# Patient Record
Sex: Female | Born: 1935 | Race: White | Hispanic: No | Smoking: Never smoker
Health system: Southern US, Community
[De-identification: ages and names within clinical notes are randomized; demographics above are authoritative.]

## PROBLEM LIST (undated history)

## (undated) DIAGNOSIS — M810 Age-related osteoporosis without current pathological fracture: Secondary | ICD-10-CM

## (undated) DIAGNOSIS — E7800 Pure hypercholesterolemia, unspecified: Secondary | ICD-10-CM

## (undated) DIAGNOSIS — I10 Essential (primary) hypertension: Secondary | ICD-10-CM

## (undated) DIAGNOSIS — E039 Hypothyroidism, unspecified: Secondary | ICD-10-CM

## (undated) DIAGNOSIS — K219 Gastro-esophageal reflux disease without esophagitis: Secondary | ICD-10-CM

## (undated) HISTORY — DX: Gastro-esophageal reflux disease without esophagitis: K21.9

## (undated) HISTORY — DX: Pure hypercholesterolemia, unspecified: E78.00

## (undated) HISTORY — DX: Age-related osteoporosis without current pathological fracture: M81.0

## (undated) HISTORY — PX: CATARACT EXTRACTION, BILATERAL: SHX1313

## (undated) HISTORY — DX: Hypothyroidism, unspecified: E03.9

---

## 1999-02-26 ENCOUNTER — Encounter: Payer: Self-pay | Admitting: Family Medicine

## 1999-02-26 ENCOUNTER — Encounter: Admission: RE | Admit: 1999-02-26 | Discharge: 1999-02-26 | Payer: Self-pay | Admitting: Family Medicine

## 1999-03-03 ENCOUNTER — Encounter: Admission: RE | Admit: 1999-03-03 | Discharge: 1999-03-03 | Payer: Self-pay | Admitting: Family Medicine

## 1999-03-03 ENCOUNTER — Encounter: Payer: Self-pay | Admitting: Family Medicine

## 1999-05-13 ENCOUNTER — Other Ambulatory Visit: Admission: RE | Admit: 1999-05-13 | Discharge: 1999-05-13 | Payer: Self-pay | Admitting: Obstetrics and Gynecology

## 1999-09-01 ENCOUNTER — Encounter: Admission: RE | Admit: 1999-09-01 | Discharge: 1999-09-01 | Payer: Self-pay | Admitting: Family Medicine

## 1999-09-01 ENCOUNTER — Encounter: Payer: Self-pay | Admitting: Family Medicine

## 2000-02-28 ENCOUNTER — Encounter: Admission: RE | Admit: 2000-02-28 | Discharge: 2000-02-28 | Payer: Self-pay | Admitting: Family Medicine

## 2000-02-28 ENCOUNTER — Encounter: Payer: Self-pay | Admitting: Family Medicine

## 2000-11-28 ENCOUNTER — Other Ambulatory Visit: Admission: RE | Admit: 2000-11-28 | Discharge: 2000-11-28 | Payer: Self-pay | Admitting: Family Medicine

## 2000-12-14 ENCOUNTER — Encounter: Payer: Self-pay | Admitting: Family Medicine

## 2000-12-14 ENCOUNTER — Encounter: Admission: RE | Admit: 2000-12-14 | Discharge: 2000-12-14 | Payer: Self-pay | Admitting: Family Medicine

## 2001-03-22 ENCOUNTER — Encounter: Admission: RE | Admit: 2001-03-22 | Discharge: 2001-03-22 | Payer: Self-pay | Admitting: Family Medicine

## 2001-03-22 ENCOUNTER — Encounter: Payer: Self-pay | Admitting: Family Medicine

## 2002-05-23 ENCOUNTER — Encounter: Admission: RE | Admit: 2002-05-23 | Discharge: 2002-05-23 | Payer: Self-pay | Admitting: Family Medicine

## 2002-05-23 ENCOUNTER — Encounter: Payer: Self-pay | Admitting: Family Medicine

## 2003-05-26 ENCOUNTER — Encounter: Admission: RE | Admit: 2003-05-26 | Discharge: 2003-05-26 | Payer: Self-pay | Admitting: Family Medicine

## 2004-05-26 ENCOUNTER — Encounter: Admission: RE | Admit: 2004-05-26 | Discharge: 2004-05-26 | Payer: Self-pay | Admitting: Family Medicine

## 2005-06-01 ENCOUNTER — Encounter: Admission: RE | Admit: 2005-06-01 | Discharge: 2005-06-01 | Payer: Self-pay | Admitting: Family Medicine

## 2006-06-13 ENCOUNTER — Encounter: Admission: RE | Admit: 2006-06-13 | Discharge: 2006-06-13 | Payer: Self-pay | Admitting: Family Medicine

## 2007-06-20 ENCOUNTER — Encounter: Admission: RE | Admit: 2007-06-20 | Discharge: 2007-06-20 | Payer: Self-pay | Admitting: Family Medicine

## 2008-08-12 ENCOUNTER — Encounter: Admission: RE | Admit: 2008-08-12 | Discharge: 2008-08-12 | Payer: Self-pay | Admitting: Family Medicine

## 2009-05-03 DEATH — deceased

## 2010-04-07 ENCOUNTER — Other Ambulatory Visit: Payer: Self-pay | Admitting: Family Medicine

## 2010-04-07 DIAGNOSIS — Z1231 Encounter for screening mammogram for malignant neoplasm of breast: Secondary | ICD-10-CM

## 2010-04-12 ENCOUNTER — Ambulatory Visit
Admission: RE | Admit: 2010-04-12 | Discharge: 2010-04-12 | Disposition: A | Discharge status: Home / Self Care | Payer: Medicare Other | Source: Ambulatory Visit | Attending: Family Medicine | Admitting: Family Medicine

## 2010-04-12 DIAGNOSIS — Z1231 Encounter for screening mammogram for malignant neoplasm of breast: Secondary | ICD-10-CM

## 2011-12-01 ENCOUNTER — Encounter (HOSPITAL_COMMUNITY): Payer: Self-pay | Admitting: Emergency Medicine

## 2011-12-01 ENCOUNTER — Inpatient Hospital Stay (HOSPITAL_COMMUNITY)
Admission: EM | Admit: 2011-12-01 | Discharge: 2011-12-03 | DRG: 315 | Disposition: A | Discharge status: Home / Self Care | Payer: Medicare Other | Attending: Internal Medicine | Admitting: Internal Medicine

## 2011-12-01 ENCOUNTER — Emergency Department (HOSPITAL_COMMUNITY): Payer: Medicare Other

## 2011-12-01 DIAGNOSIS — E039 Hypothyroidism, unspecified: Secondary | ICD-10-CM | POA: Diagnosis present

## 2011-12-01 DIAGNOSIS — E869 Volume depletion, unspecified: Secondary | ICD-10-CM | POA: Diagnosis present

## 2011-12-01 DIAGNOSIS — Z7982 Long term (current) use of aspirin: Secondary | ICD-10-CM

## 2011-12-01 DIAGNOSIS — R011 Cardiac murmur, unspecified: Secondary | ICD-10-CM | POA: Diagnosis present

## 2011-12-01 DIAGNOSIS — I639 Cerebral infarction, unspecified: Secondary | ICD-10-CM | POA: Diagnosis present

## 2011-12-01 DIAGNOSIS — E7800 Pure hypercholesterolemia, unspecified: Secondary | ICD-10-CM | POA: Diagnosis present

## 2011-12-01 DIAGNOSIS — Z79899 Other long term (current) drug therapy: Secondary | ICD-10-CM

## 2011-12-01 DIAGNOSIS — Z881 Allergy status to other antibiotic agents status: Secondary | ICD-10-CM

## 2011-12-01 DIAGNOSIS — R29810 Facial weakness: Secondary | ICD-10-CM | POA: Diagnosis present

## 2011-12-01 DIAGNOSIS — I10 Essential (primary) hypertension: Secondary | ICD-10-CM | POA: Diagnosis present

## 2011-12-01 DIAGNOSIS — R42 Dizziness and giddiness: Secondary | ICD-10-CM | POA: Diagnosis present

## 2011-12-01 DIAGNOSIS — E871 Hypo-osmolality and hyponatremia: Secondary | ICD-10-CM | POA: Diagnosis present

## 2011-12-01 DIAGNOSIS — E876 Hypokalemia: Secondary | ICD-10-CM | POA: Diagnosis present

## 2011-12-01 DIAGNOSIS — E785 Hyperlipidemia, unspecified: Secondary | ICD-10-CM | POA: Diagnosis present

## 2011-12-01 DIAGNOSIS — I959 Hypotension, unspecified: Principal | ICD-10-CM | POA: Diagnosis present

## 2011-12-01 HISTORY — DX: Pure hypercholesterolemia, unspecified: E78.00

## 2011-12-01 HISTORY — DX: Essential (primary) hypertension: I10

## 2011-12-01 LAB — CBC
HCT: 33.8 % — ABNORMAL LOW (ref 36.0–46.0)
MCH: 29 pg (ref 26.0–34.0)
MCHC: 34 g/dL (ref 30.0–36.0)
MCV: 85.1 fL (ref 78.0–100.0)
Platelets: 224 10*3/uL (ref 150–400)
RBC: 3.97 MIL/uL (ref 3.87–5.11)
WBC: 9.2 10*3/uL (ref 4.0–10.5)

## 2011-12-01 LAB — BASIC METABOLIC PANEL
Creatinine, Ser: 1.06 mg/dL (ref 0.50–1.10)
GFR calc Af Amer: 56 mL/min — ABNORMAL LOW (ref >90)
Glucose, Bld: 123 mg/dL — ABNORMAL HIGH (ref 70–99)
Potassium: 3.8 mEq/L (ref 3.5–5.1)

## 2011-12-01 LAB — URINALYSIS, ROUTINE W REFLEX MICROSCOPIC
Glucose, UA: NEGATIVE mg/dL
Hgb urine dipstick: NEGATIVE
Ketones, ur: NEGATIVE mg/dL
Leukocytes, UA: NEGATIVE
pH: 6.5 (ref 5.0–8.0)

## 2011-12-01 LAB — TROPONIN I: Troponin I: <0.3 ng/mL (ref <0.30)

## 2011-12-01 LAB — GLUCOSE, CAPILLARY: Glucose-Capillary: 128 mg/dL — ABNORMAL HIGH (ref 70–99)

## 2011-12-01 MED ORDER — MAGNESIUM OXIDE 400 MG PO TABS: 400.0000 mg | ORAL_TABLET | Freq: Every day | ORAL | Status: DC

## 2011-12-01 MED ORDER — ASPIRIN EC 81 MG PO TBEC
81.0000 mg | DELAYED_RELEASE_TABLET | Freq: Every day | ORAL | Status: DC
  Administered 2011-12-02 – 2011-12-03 (×2): 81 mg via ORAL
  Filled 2011-12-01 (×3): qty 1

## 2011-12-01 MED ORDER — ONDANSETRON HCL 4 MG PO TABS: 4.0000 mg | ORAL_TABLET | Freq: Four times a day (QID) | ORAL | Status: DC | PRN

## 2011-12-01 MED ORDER — HYDROCODONE-ACETAMINOPHEN 5-325 MG PO TABS: 1.0000 | ORAL_TABLET | ORAL | Status: DC | PRN

## 2011-12-01 MED ORDER — ACETAMINOPHEN 325 MG PO TABS
650.0000 mg | ORAL_TABLET | Freq: Four times a day (QID) | ORAL | Status: DC | PRN
  Administered 2011-12-01 – 2011-12-02 (×4): 650 mg via ORAL
  Filled 2011-12-01 (×4): qty 2

## 2011-12-01 MED ORDER — ONDANSETRON HCL 4 MG/2ML IJ SOLN: 4.0000 mg | Freq: Four times a day (QID) | INTRAMUSCULAR | Status: DC | PRN

## 2011-12-01 MED ORDER — SODIUM CHLORIDE 0.9 % IV SOLN
INTRAVENOUS | Status: AC
  Administered 2011-12-01: 18:00:00 via INTRAVENOUS

## 2011-12-01 MED ORDER — LEVOTHYROXINE SODIUM 50 MCG PO TABS
50.0000 ug | ORAL_TABLET | Freq: Every day | ORAL | Status: DC
  Administered 2011-12-02 – 2011-12-03 (×2): 50 ug via ORAL
  Filled 2011-12-01 (×3): qty 1

## 2011-12-01 MED ORDER — SODIUM CHLORIDE 0.9 % IJ SOLN
3.0000 mL | Freq: Two times a day (BID) | INTRAMUSCULAR | Status: DC
  Administered 2011-12-02 (×2): 3 mL via INTRAVENOUS

## 2011-12-01 MED ORDER — METOPROLOL TARTRATE 1 MG/ML IV SOLN
5.0000 mg | INTRAVENOUS | Status: DC | PRN
  Filled 2011-12-01: qty 5

## 2011-12-01 MED ORDER — POTASSIUM CHLORIDE CRYS ER 20 MEQ PO TBCR
20.0000 meq | EXTENDED_RELEASE_TABLET | Freq: Every day | ORAL | Status: DC
  Administered 2011-12-01 – 2011-12-02 (×2): 20 meq via ORAL
  Filled 2011-12-01 (×2): qty 1

## 2011-12-01 MED ORDER — ALBUTEROL SULFATE (5 MG/ML) 0.5% IN NEBU: 2.5000 mg | INHALATION_SOLUTION | RESPIRATORY_TRACT | Status: DC | PRN

## 2011-12-01 MED ORDER — CALCIUM CARBONATE 600 MG PO TABS
600.0000 mg | ORAL_TABLET | Freq: Two times a day (BID) | ORAL | Status: DC
  Filled 2011-12-01: qty 1

## 2011-12-01 MED ORDER — GUAIFENESIN-DM 100-10 MG/5ML PO SYRP: 5.0000 mL | ORAL_SOLUTION | ORAL | Status: DC | PRN

## 2011-12-01 MED ORDER — POLYETHYLENE GLYCOL 3350 17 G PO PACK
17.0000 g | PACK | Freq: Every day | ORAL | Status: DC | PRN
  Filled 2011-12-01: qty 1

## 2011-12-01 MED ORDER — CALCIUM CARBONATE 1250 (500 CA) MG PO TABS
1.0000 | ORAL_TABLET | Freq: Two times a day (BID) | ORAL | Status: DC
  Administered 2011-12-02 – 2011-12-03 (×3): 500 mg via ORAL
  Filled 2011-12-01 (×6): qty 1

## 2011-12-01 MED ORDER — SIMVASTATIN 10 MG PO TABS
10.0000 mg | ORAL_TABLET | Freq: Every day | ORAL | Status: DC
  Administered 2011-12-01 – 2011-12-02 (×2): 10 mg via ORAL
  Filled 2011-12-01 (×3): qty 1

## 2011-12-01 MED ORDER — MAGNESIUM OXIDE 400 (241.3 MG) MG PO TABS
400.0000 mg | ORAL_TABLET | Freq: Every day | ORAL | Status: DC
  Administered 2011-12-02 – 2011-12-03 (×2): 400 mg via ORAL
  Filled 2011-12-01 (×3): qty 1

## 2011-12-01 NOTE — Consult Note (Signed)
Reason for Consult:Facial droop Referring Physician: Rhunette Croft, A  CC: Facial droop  History is obtained from:Patient, family  HPI: Stacey Stevens is a 78 y.o. female who was walking towards the bathroom when she felt lightheaded. She subsequently had her vision go out and felt very lightheaded. EMS was called and on arrival, they were unable to palpate her pulse or get a BP, but it subsequently was obtained at systolic 101. She was given IV fluids with improvement to the 140s. The majority of her symptoms resolved, but she was left with a new left facial droop and tongue deviation.   Neurology was consulted for recommendations.   LSN: 3 pm NIHSS: 1 tpa given: no, mild deficit   ROS: A 14 point ROS was performed and is negative except as noted in the HPI.  Past Medical History  Diagnosis Date  . Hypertension   . High blood cholesterol     Family History: Father - brainstem stroke  Social History: Tob: none  Exam: Current vital signs: BP 128/58  Pulse 84  Temp 97.8 F (36.6 C) (Oral)  Resp 18  SpO2 99% Vital signs in last 24 hours: Temp:  [97.8 F (36.6 C)] 97.8 F (36.6 C) (11/28 1559) Pulse Rate:  [84] 84  (11/28 1559) Resp:  [18] 18  (11/28 1559) BP: (128)/(58) 128/58 mmHg (11/28 1559) SpO2:  [99 %] 99 % (11/28 1559)  General: in bed, nad CV: RRR Mental Status: Patient is awake, alert, oriented to person, place, month, year, and situation. Immediate and remote memory are intact. Patient is able to give a clear and coherent history. Cranial Nerves: II: Visual Fields are full. Pupils are equal, round, and reactive to light.  Discs are difficult to visualize. III,IV, VI: EOMI without ptosis or diploplia.  V: Facial sensation is symmetric to temperature VII: Facial movement is notable for a left facial weakness on smile.  VIII: hearing is intact to voice X: Uvula elevates symmetrically XI: Shoulder shrug is symmetric. XII: tongue deviates mildly to the left.    Motor: Tone is normal. Bulk is normal. 5/5 strength was present in all four extremities.  Sensory: Sensation is symmetric to light touch and temperature in the arms and legs. Deep Tendon Reflexes: 2+ and symmetric in the biceps and patellae.  Cerebellar: FNF and hks are intact bilaterally Gait: Did not assess 2/2 multiple monitors in the ED setting  I have reviewed labs in epic and the results pertinent to this consultation are: CBC - mild anemia  I have reviewed the images obtained:CT head - no acute changes.   Impression: 78 yo F with transient episode of lightheadedness and vision change in the setting of likely low BP. The reason for her low BP is unclear to me, but I suspect that she hypoperfused causing posterior circulation ischemia. With both visual fields affected and low BP, I feel that embolic/thrombotic etiology is less likely. The cause of her BP drop is unclear to me, but will need a workup for this, but I will defer to medicine.   Recommendations: 1. HgbA1c, fasting lipid panel 2. MRI, MRA  of the brain without contrast. MRA neck with contrast 3. PT consult, OT consult, Speech consult 4. Echocardiogram 5. Carotid dopplers do not need to be performed as we will get MRA neck.  6. Prophylactic therapy-Antiplatelet med: Aspirin - dose 81 mg 7. Risk factor modification 8. Telemetry monitoring 9. Frequent neuro checks   Ritta Slot, MD [facility] [phone]  If 7pm-  7am, please page neurology on call at [phone].

## 2011-12-01 NOTE — ED Notes (Signed)
Pt arrived by EMS. Pt at church and cleaning up [employer], went to the bathroom upstairs, came down stair became weak and dizzy. When EMS arrived, unable to obtain radial pulse and auscultate BP. Then BP came to 101 systolic. IV started went to give fluids, BP was checked again and 140/74.

## 2011-12-01 NOTE — ED Provider Notes (Addendum)
History     CSN: [id number]  Arrival date & time 12/01/11  1534   First MD Initiated Contact with Patient 12/01/11 1538      Chief Complaint  Patient presents with  . Dizziness  . Weakness    (Consider location/radiation/quality/duration/timing/severity/associated sxs/prior treatment) Patient is a 78 y.o. female presenting with neurologic complaint. The history is provided by the patient.  Neurologic Problem The primary symptoms include loss of consciousness and dizziness. Primary symptoms do not include fever. The symptoms began less than 1 hour ago. The symptoms are unchanged. The neurological symptoms are diffuse.  Loss of consciousness began less than 1 hour ago. The loss of consciousness lasted less than one minute. The loss of consciousness was not witnessed.  Dizziness also occurs with weakness (Diffuse).  Additional symptoms include weakness (Diffuse). Additional symptoms do not include neck stiffness.    Past Medical History  Diagnosis Date  . Hypertension   . High blood cholesterol     History reviewed. No pertinent past surgical history.  No family history on file.  History  Substance Use Topics  . Smoking status: Never Smoker   . Smokeless tobacco: Not on file  . Alcohol Use: No    OB History    Grav Para Term Preterm Abortions TAB SAB Ect Mult Living                  Review of Systems  Constitutional: Negative for fever and chills.  HENT: Negative for neck stiffness.   Respiratory: Negative for cough and shortness of breath.   Neurological: Positive for dizziness, loss of consciousness and weakness (Diffuse).  All other systems reviewed and are negative.    Allergies  Amoxicillin; Keflex; and Macrodantin  Home Medications   Current Outpatient Rx  Name  Route  Sig  Dispense  Refill  . AMLODIPINE BESYLATE 5 MG PO TABS   Oral   Take 5 mg by mouth daily.         . ASPIRIN EC 81 MG PO TBEC   Oral   Take 81 mg by mouth daily.           Marland Kitchen CALCIUM CARBONATE 600 MG PO TABS   Oral   Take 600 mg by mouth 2 (two) times daily with a meal.         . LEVOTHYROXINE SODIUM 50 MCG PO TABS   Oral   Take 50 mcg by mouth daily.         Marland Kitchen LOSARTAN POTASSIUM-HCTZ 50-12.5 MG PO TABS   Oral   Take 1 tablet by mouth daily.         Marland Kitchen LOVASTATIN 20 MG PO TABS   Oral   Take 20 mg by mouth at bedtime.         Marland Kitchen MAGNESIUM OXIDE 400 MG PO TABS   Oral   Take 400 mg by mouth daily.         . ADULT MULTIVITAMIN W/MINERALS CH   Oral   Take 1 tablet by mouth daily.         Marland Kitchen POTASSIUM CHLORIDE CRYS ER 20 MEQ PO TBCR   Oral   Take 20 mEq by mouth daily.         Marland Kitchen RALOXIFENE HCL 60 MG PO TABS   Oral   Take 60 mg by mouth daily.           BP 128/58  Pulse 84  Temp 97.8 F (36.6 C) (Oral)  Resp  18  SpO2 99%  Physical Exam  Nursing note and vitals reviewed. Constitutional: She is oriented to person, place, and time. She appears well-developed and well-nourished. No distress.  HENT:  Head: Normocephalic and atraumatic.  Eyes: EOM are normal. Pupils are equal, round, and reactive to light.  Neck: Normal range of motion. Neck supple.  Cardiovascular: Normal rate and regular rhythm.  Exam reveals no friction rub.   No murmur heard. Pulmonary/Chest: Effort normal and breath sounds normal. No respiratory distress. She has no wheezes. She has no rales.  Abdominal: Soft. She exhibits no distension. There is no tenderness. There is no rebound.  Musculoskeletal: Normal range of motion. She exhibits no edema.  Neurological: She is alert and oriented to person, place, and time. A cranial nerve deficit (left-sided facial droop, left tongue deviation.) is present. She exhibits abnormal muscle tone (Mild left-sided weakness.).  Skin: She is not diaphoretic.    ED Course  Procedures (including critical care time)   Labs Reviewed  CBC  BASIC METABOLIC PANEL  URINALYSIS, ROUTINE W REFLEX MICROSCOPIC  TROPONIN I   LACTIC ACID, PLASMA   Ct Head Wo Contrast  12/01/2011  *RADIOLOGY REPORT*  Clinical Data: 78 year old female with left facial droop.  CT HEAD WITHOUT CONTRAST  Technique:  Contiguous axial images were obtained from the base of the skull through the vertex without contrast.  Comparison: None  Findings: An age indeterminate lacunar infarct within the left thalamus is noted. Mild chronic small vessel white matter ischemic changes are present.  No acute intracranial abnormalities are identified, including mass lesion or mass effect, hydrocephalus, extra-axial fluid collection, midline shift or hemorrhage.  The visualized bony calvarium is unremarkable.  IMPRESSION: Age indeterminate left thalamic lacunar infarct.  No evidence of hemorrhage.  Mild chronic small vessel white matter ischemic changes.   Original Report Authenticated By: Harmon Pier, M.D.    Dg Chest Port 1 View  12/01/2011  *RADIOLOGY REPORT*  Clinical Data: Weakness  PORTABLE CHEST - 1 VIEW  Comparison: None.  Findings: Lungs are clear without infiltrate or effusion.  Negative for heart failure or pneumonia or mass lesion.  The heart size is mildly enlarged.  IMPRESSION: No active cardiopulmonary disease.   Original Report Authenticated By: Janeece Riggers, M.D.      1. High blood cholesterol   2. Hypertension   3. Dizziness   4. Dyslipidemia   5. HTN (hypertension)      Date: 12/01/2011  Rate: 81  Rhythm: normal sinus rhythm  QRS Axis: normal  Intervals: QRS prolonged, 132  ST/T Wave abnormalities: normal  Conduction Disutrbances:right bundle branch block  Narrative Interpretation:   Old EKG Reviewed: none available    MDM   55F with hx of HTN, HLD presents dizziness, weakness. Patient was cooking food all day for Thanksgiving and began to have abdominal pain in her low abdomen. Pain non-radiating. She then felt very light headed and weak. No syncope. Denied CP, SOB. Initially with EMS they were unable to obtain a pressure,  then during transport patient's pressure returned to normal and she states she felt much better. On arrival vitals stable, no pain, no more dizziness. On neurologic exam - L sided facial droop noticed. Mild tongue deviation to the left. Mild left-sided weakness. Patient does not know time of exact onset. Son states some confusion about 3 hours ago prior to the meal, however no one noticed any facial droop. I spoke with Dr. Amada Jupiter with Neuro who states she does not meet TPA  criteria so she doesn't need a Code Stroke activation. She was sent for emergent Head CT and Neuro will see her.  Head CT with age-indeterminate lacunar infarct. I spoke with Dr. Amada Jupiter of neurology states that he feels that her hypotension during her syncopal episode caused some diffuse ischemia and a brainstem stroke. She does not need an emergent MRI, however she will be admitted by medicine for MRI in the morning. Patient stable upon transfer to the floor.        Elwin Mocha, MD 12/01/11 2039 signed out as he is  Elwin Mocha, MD 12/03/11 [phone]

## 2011-12-01 NOTE — H&P (Signed)
[facility]                                                                                    Patient Demographics  Stacey Stevens, is a 78 y.o. female  CSN: [id number]  MRN: [id number]  DOB - Oct 05, 1928  Admit Date - 12/01/2011  Outpatient Primary MD for the patient is Benita Stabile, MD   With History of -  Past Medical History  Diagnosis Date  . Hypertension   . High blood cholesterol       History reviewed. No pertinent past surgical history.  in for   Chief Complaint  Patient presents with  . Dizziness  . Weakness     HPI  Stacey Stevens  is a 78 y.o. female, who has history of hypothyroidism, hypertension, dyslipidemia, heart murmur who was in her usual state of health and spending time with family over Thanksgiving dinner, patient says she was in her kitchen when she started feeling poorly, she had a sensation of going to the bathroom and as she was climbing her stairs she started feeling dizzy and her vision got blurred, family members thought that she was having a stroke and they called 911, when EMS arrived blood pressure was found to be low, she was brought to the ER where after some IV fluids blood pressure was stabilized, patient's initial presentation showed left-sided facial weakness, initial workup showed slightly low sodium along with CT evidence of age unclear stroke.  She was seen by neurologist who suggested the internal medicine admission, at present time patient is readily symptom-free, she is feeling close to her baseline, her exam reveals mild left-sided facial weakness otherwise unremarkable exam.    Review of Systems  at the present time negative review of systems  In addition to the HPI above,  No Fever-chills, No Headache, No changes with Vision or hearing, No problems swallowing food or Liquids, No Chest pain, Cough or Shortness of Breath, No Abdominal pain, No Nausea or Vommitting, Bowel movements are regular, No Blood in  stool or Urine, No dysuria, No new skin rashes or bruises, No new joints pains-aches,  No new weakness, tingling, numbness in any extremity, No recent weight gain or loss, No polyuria, polydypsia or polyphagia, No significant Mental Stressors.  A full 10 point Review of Systems was done, except as stated above, all other Review of Systems were negative.   Social History History  Substance Use Topics  . Smoking status: Never Smoker   . Smokeless tobacco: Not on file  . Alcohol Use: No     Family History History of CVA in patient's father  Prior to Admission medications   Medication Sig Start Date End Date Taking? Authorizing Provider  amLODipine (NORVASC) 5 MG tablet Take 5 mg by mouth daily.   Yes Historical Provider, MD  aspirin EC 81 MG tablet Take 81 mg by mouth daily.   Yes Historical Provider, MD  calcium carbonate (OS-CAL) 600 MG TABS Take 600 mg by mouth 2 (two) times daily with a meal.   Yes Historical Provider, MD  levothyroxine (SYNTHROID, LEVOTHROID) 50 MCG tablet Take 50 mcg by mouth daily.   Yes  Historical Provider, MD  losartan-hydrochlorothiazide (HYZAAR) 50-12.5 MG per tablet Take 1 tablet by mouth daily.   Yes Historical Provider, MD  lovastatin (MEVACOR) 20 MG tablet Take 20 mg by mouth at bedtime.   Yes Historical Provider, MD  magnesium oxide (MAG-OX) 400 MG tablet Take 400 mg by mouth daily.   Yes Historical Provider, MD  Multiple Vitamin (MULTIVITAMIN WITH MINERALS) TABS Take 1 tablet by mouth daily.   Yes Historical Provider, MD  potassium chloride SA (K-DUR,KLOR-CON) 20 MEQ tablet Take 20 mEq by mouth daily.   Yes Historical Provider, MD  raloxifene (EVISTA) 60 MG tablet Take 60 mg by mouth daily.   Yes Historical Provider, MD    Allergies  Allergen Reactions  . Amoxicillin Other (See Comments)    Reaction unknown  . Keflex (Cephalexin) Other (See Comments)    Reaction unknown  . Macrodantin (Nitrofurantoin Macrocrystal) Other (See Comments)     Reaction unknown    Physical Exam  Vitals  Blood pressure 135/59, pulse 84, temperature 98.6 F (37 C), temperature source Oral, resp. rate 18, SpO2 97.00%.   1. General elderly white female lying in bed in NAD,   2. Normal affect and insight, Not Suicidal or Homicidal, Awake Alert, Oriented X 3.  3. No F.N deficits, Mild L.facial weakness all other C.Nerves Intact, Strength 5/5 all 4 extremities, Sensation intact all 4 extremities, Plantars down going.  4. Ears and Eyes appear Normal, Conjunctivae clear, PERRLA. Moist Oral Mucosa.  5. Supple Neck, No JVD, No cervical lymphadenopathy appriciated, No Carotid Bruits.  6. Symmetrical Chest wall movement, Good air movement bilaterally, CTAB.  7. RRR, No Gallops, Rubs, loud systolic murmur Murmurs, No Parasternal Heave.  8. Positive Bowel Sounds, Abdomen Soft, Non tender, No organomegaly appriciated,No rebound -guarding or rigidity.  9.  No Cyanosis, Normal Skin Turgor, No Skin Rash or Bruise.  10. Good muscle tone,  joints appear normal , no effusions, Normal ROM.  11. No Palpable Lymph Nodes in Neck or Axillae     Data Review  CBC  Lab 12/01/11 1635  WBC 9.2  HGB 11.5*  HCT 33.8*  PLT 224  MCV 85.1  MCH 29.0  MCHC 34.0  RDW 11.9  LYMPHSABS --  MONOABS --  EOSABS --  BASOSABS --  BANDABS --   ------------------------------------------------------------------------------------------------------------------  Chemistries   Lab 12/01/11 1635  NA 128*  K 3.8  CL 91*  CO2 25  GLUCOSE 123*  BUN 25*  CREATININE 1.06  CALCIUM 9.6  MG --  AST --  ALT --  ALKPHOS --  BILITOT --   ------------------------------------------------------------------------------------------------------------------ CrCl is unknown because there is no height on file for the current visit. ------------------------------------------------------------------------------------------------------------------ No results found for this  basename: TSH,T4TOTAL,FREET3,T3FREE,THYROIDAB in the last 72 hours   Coagulation profile No results found for this basename: INR:5,PROTIME:5 in the last 168 hours ------------------------------------------------------------------------------------------------------------------- No results found for this basename: DDIMER:2 in the last 72 hours -------------------------------------------------------------------------------------------------------------------  Cardiac Enzymes  Lab 12/01/11 1635  CKMB --  TROPONINI <0.30  MYOGLOBIN --   ------------------------------------------------------------------------------------------------------------------ No components found with this basename: POCBNP:3   ---------------------------------------------------------------------------------------------------------------  Urinalysis No results found for this basename: colorurine, appearanceur, labspec, phurine, glucoseu, hgbur, bilirubinur, ketonesur, proteinur, urobilinogen, nitrite, leukocytesur    ----------------------------------------------------------------------------------------------------------------  Imaging results:   Ct Head Wo Contrast  12/01/2011  *RADIOLOGY REPORT*  Clinical Data: 78 year old female with left facial droop.  CT HEAD WITHOUT CONTRAST  Technique:  Contiguous axial images were obtained from the base of the skull through  the vertex without contrast.  Comparison: None  Findings: An age indeterminate lacunar infarct within the left thalamus is noted. Mild chronic small vessel white matter ischemic changes are present.  No acute intracranial abnormalities are identified, including mass lesion or mass effect, hydrocephalus, extra-axial fluid collection, midline shift or hemorrhage.  The visualized bony calvarium is unremarkable.  IMPRESSION: Age indeterminate left thalamic lacunar infarct.  No evidence of hemorrhage.  Mild chronic small vessel white matter ischemic changes.    Original Report Authenticated By: Harmon Pier, M.D.    Dg Chest Port 1 View  12/01/2011  *RADIOLOGY REPORT*  Clinical Data: Weakness  PORTABLE CHEST - 1 VIEW  Comparison: None.  Findings: Lungs are clear without infiltrate or effusion.  Negative for heart failure or pneumonia or mass lesion.  The heart size is mildly enlarged.  IMPRESSION: No active cardiopulmonary disease.   Original Report Authenticated By: Janeece Riggers, M.D.     My personal review of EKG: Rhythm NSR, RBBB , no Acute ST changes    Assessment & Plan   1. Left-sided facial weakness, dizziness, blurry vision - due to stroke versus hypoperfusion due to transient hypotension could be from vasovagal etiology, patient has been seen by neurology, her symptoms have resolved, exam is consistent with mild left-sided facial weakness, she will be admitted on the neuro floor, telemetry monitoring, she will undergo MRI MRA of the brain, echogram, carotid duplex, A1c, lipid panel, evaluation by PT, OT and speech, she has passed a bedside swallow study, will also monitor her orthostatics, since she is hyponatremic and was hypotensive initially she could be mildly dehydrated gentle normal saline for hydration. We'll place her on aspirin for now.   2. Hyponatremia which is mild. Could be secondary to mild dehydration, will check urine sodium and osmolality, will check serum of severity, will place her on normal saline gently and repeat BMP in the morning.    3. Hypertension.- Medications will be held as she was hypotensive during her initial presentation and there is a chance that she could have a new CVA, will order when necessary IV Lopressor with written parameters, allow for permissive hypertension at this time.    4. History of dyslipidemia and hypothyroidism. No acute issues home medications to be continued include statin and Synthroid, will check a lipid panel as in #1 above.     DVT Prophylaxis  SCDs    AM Labs Ordered, also  please review Full Orders  Family Communication: Admission, patients condition and plan of care including tests being ordered have been discussed with the patient and sons who indicate understanding and agree with the plan and Code Status.  Code Status Full  Disposition Plan: Home  Time spent in minutes : 35  Condition Stacey Stevens K M.D on 12/01/2011 at 6:15 PM  Between 7am to 7pm - Pager - [phone]  After 7pm go to [email or web address] - password [email or web address]  And look for the night coverage person covering me after hours  [facility] Office  [phone]

## 2011-12-01 NOTE — Progress Notes (Signed)
Stacey Pastel, NP called regarding missed beat on telemetry. Pt running NSR with occasional PVCs. Pt asymptomatic. Denies dizziness, nausea, or feeling lightheaded. States that she still feels "wobbly" when standing up. Diastolic BP low. Pt receiving fluids. Will continue to monitor heart rhythm and BP.  Salvadore Oxford, RN 12/01/11 [phone]

## 2011-12-02 ENCOUNTER — Inpatient Hospital Stay (HOSPITAL_COMMUNITY): Payer: Medicare Other

## 2011-12-02 DIAGNOSIS — I6350 Cerebral infarction due to unspecified occlusion or stenosis of unspecified cerebral artery: Secondary | ICD-10-CM

## 2011-12-02 DIAGNOSIS — I6789 Other cerebrovascular disease: Secondary | ICD-10-CM

## 2011-12-02 LAB — URINE CULTURE

## 2011-12-02 LAB — LIPID PANEL
Cholesterol: 127 mg/dL (ref 0–200)
LDL Cholesterol: 55 mg/dL (ref 0–99)
Total CHOL/HDL Ratio: 2 RATIO
Triglycerides: 45 mg/dL (ref <150)
VLDL: 9 mg/dL (ref 0–40)

## 2011-12-02 LAB — BASIC METABOLIC PANEL
BUN: 18 mg/dL (ref 6–23)
CO2: 26 mEq/L (ref 19–32)
Calcium: 8.3 mg/dL — ABNORMAL LOW (ref 8.4–10.5)
Creatinine, Ser: 0.62 mg/dL (ref 0.50–1.10)
GFR calc non Af Amer: 82 mL/min — ABNORMAL LOW (ref >90)
Glucose, Bld: 112 mg/dL — ABNORMAL HIGH (ref 70–99)

## 2011-12-02 LAB — CBC
MCH: 29.7 pg (ref 26.0–34.0)
MCHC: 35 g/dL (ref 30.0–36.0)
MCV: 84.8 fL (ref 78.0–100.0)
Platelets: 174 10*3/uL (ref 150–400)
RDW: 12 % (ref 11.5–15.5)

## 2011-12-02 LAB — OCCULT BLOOD X 1 CARD TO LAB, STOOL: Fecal Occult Bld: POSITIVE

## 2011-12-02 MED ORDER — POTASSIUM CHLORIDE CRYS ER 20 MEQ PO TBCR
40.0000 meq | EXTENDED_RELEASE_TABLET | ORAL | Status: AC
  Administered 2011-12-02 (×3): 40 meq via ORAL
  Filled 2011-12-02 (×4): qty 2

## 2011-12-02 MED ORDER — GADOBENATE DIMEGLUMINE 529 MG/ML IV SOLN
13.0000 mL | Freq: Once | INTRAVENOUS | Status: AC | PRN
  Administered 2011-12-02: 13 mL via INTRAVENOUS

## 2011-12-02 NOTE — Progress Notes (Signed)
VASCULAR LAB PRELIMINARY  PRELIMINARY  PRELIMINARY  PRELIMINARY  Carotid duplex completed.    Preliminary report:  Right : 40% to 59% Mid ICA stenosis lowest end of range by velocity plaque morphology does not support the increase. Probably due to tortuosity. Left : No evidence of ICA stenosis. Bilateral : Vertebral artery flow is antegrade.  Stacey Stevens, RVS 12/02/2011, 10:07 AM

## 2011-12-02 NOTE — Progress Notes (Signed)
[facility] PROGRESS NOTE  EMERI Stacey Stevens:[id number] DOB: Feb 06, 1928 DOA: 12/01/2011 PCP: Benita Stabile, MD  Assessment/Plan: 1. Left-sided facial weakness, dizziness, blurry vision - possibly due to hypoperfusion from transient hypotension could be from vasovagal etiology. -MRI negative for infarct, followup on echo -Await PT OT eval -Neuro to follow for further recommendations. 2. Hyponatremia which is mild. Could be secondary to mild dehydration -Improving with hydration, follow and recheck. 3. Hypertension. -BP stable off meds, continue monitoring and further treat accordingly. 4. History of dyslipidemia and hypothyroidism. No acute issues home medications to be continued include statin and Synthroid. 5. Hypokalemia-replace potassium  Code Status: Full Family Communication: Onset bedside Disposition Plan: Pending PT eval   Consultants:  Neurology  Procedures:  Carotid Dopplers-right with 40-59% ICA stenosis  Antibiotics:  None  HPI/Subjective: States she feels much better today, denies any complaints-no further dizziness or chest pain   Objective: Filed Vitals:   12/02/11 0737 12/02/11 1035 12/02/11 1037 12/02/11 1039  BP: 117/43 144/52 148/57 135/61  Pulse: 84 82 86 90  Temp: 99.7 F (37.6 C) 98.2 F (36.8 C)    TempSrc: Oral Oral    Resp:  18    Height:      Weight:      SpO2: 100% 100%      Intake/Output Summary (Last 24 hours) at 12/02/11 1109 Last data filed at 12/01/11 2329  Gross per 24 hour  Intake      0 ml  Output    300 ml  Net   -300 ml   Filed Weights   12/01/11 1950  Weight: 63.504 kg (140 lb)    Exam:   General: Alert and oriented x3, in no apparent distress  Cardiovascular: Regular rate and rhythm, normal S1-S2  Respiratory: Clear to auscultation bilaterally  Abdomen: Soft, bowel sounds present nontender nondistended no organomegaly no masses palpable  Data Reviewed: Basic Metabolic Panel:  Lab 12/02/11  0545 12/01/11 1635  NA 129* 128*  K 2.8* 3.8  CL 95* 91*  CO2 26 25  GLUCOSE 112* 123*  BUN 18 25*  CREATININE 0.62 1.06  CALCIUM 8.3* 9.6  MG -- --  PHOS -- --   Liver Function Tests: No results found for this basename: AST:5,ALT:5,ALKPHOS:5,BILITOT:5,PROT:5,ALBUMIN:5 in the last 168 hours No results found for this basename: LIPASE:5,AMYLASE:5 in the last 168 hours No results found for this basename: AMMONIA:5 in the last 168 hours CBC:  Lab 12/02/11 0545 12/01/11 1635  WBC 13.7* 9.2  NEUTROABS -- --  HGB 9.6* 11.5*  HCT 27.4* 33.8*  MCV 84.8 85.1  PLT 174 224   Cardiac Enzymes:  Lab 12/01/11 1635  CKTOTAL --  CKMB --  CKMBINDEX --  TROPONINI <0.30   BNP (last 3 results) No results found for this basename: PROBNP:3 in the last 8760 hours CBG:  Lab 12/01/11 1552  GLUCAP 128*    No results found for this or any previous visit (from the past 240 hour(s)).   Studies: Ct Head Wo Contrast  12/01/2011  *RADIOLOGY REPORT*  Clinical Data: 78 year old female with left facial droop.  CT HEAD WITHOUT CONTRAST  Technique:  Contiguous axial images were obtained from the base of the skull through the vertex without contrast.  Comparison: None  Findings: An age indeterminate lacunar infarct within the left thalamus is noted. Mild chronic small vessel white matter ischemic changes are present.  No acute intracranial abnormalities are identified, including mass lesion or mass effect, hydrocephalus, extra-axial fluid collection, midline shift  or hemorrhage.  The visualized bony calvarium is unremarkable.  IMPRESSION: Age indeterminate left thalamic lacunar infarct.  No evidence of hemorrhage.  Mild chronic small vessel white matter ischemic changes.   Original Report Authenticated By: Harmon Pier, M.D.    Mr [facility] Wo Contrast  12/02/2011  *RADIOLOGY REPORT*  Clinical Data:  Stroke.  Hypertension and dyslipidemia.  MRI HEAD WITHOUT AND WITH CONTRAST MRA HEAD WITHOUT CONTRAST MRA  NECK WITHOUT AND WITH CONTRAST  Technique:  Multiplanar, multiecho pulse sequences of the brain and surrounding structures were obtained without and with intravenous contrast.  Angiographic images of the Circle of Willis were obtained using MRA technique without intravenous contrast. Angiographic images of the neck were obtained using MRA technique without and with intravenous contrast.  Carotid stenosis measurements (when applicable) are obtained utilizing NASCET criteria, using the distal internal carotid diameter as the denominator.  Contrast: 13mL MULTIHANCE GADOBENATE DIMEGLUMINE 529 MG/ML IV SOLN  Comparison:  CT head 12/01/2011  MRI HEAD  Findings:  Negative for acute infarct.  Chronic lacunar infarction in the left thalamus.  Chronic microvascular ischemic changes in the cerebral white matter bilaterally.  Chronic microvascular ischemia in the pons.  Negative for hemorrhage or mass.  No fluid collection or midline shift.  Ventricle size is normal.  Age appropriate atrophy.  No midline shift.  IMPRESSION: Chronic microvascular ischemic changes in the left thalamus, pons, and cerebral white matter.  No acute infarct.  MRA HEAD  Findings: Hypoplastic distal left vertebral artery with a very small lumen contributing to the basilar.  Right vertebral artery is widely patent.  PICA is patent bilaterally.  The basilar is patent bilaterally without stenosis.  Fetal origin of the right posterior cerebral artery with hypoplastic right P1 segment.  Superior cerebellar and posterior cerebral arteries are patent bilaterally. Fenestration of the proximal basilar artery is noted, a congenital variant.  Internal carotid artery is patent bilaterally without stenosis. Anterior and middle cerebral arteries are patent bilaterally without significant stenosis.  Negative for cerebral aneurysm.  IMPRESSION:  Congenital variations as above.  No significant acquired stenosis of the intracranial circulation.  MRA NECK  Findings: Right  vertebral artery is dominant  and is widely patent to the basilar.  Left vertebral artery is   small and hypoplastic distal to the left PICA.  No significant stenosis and vertebral arteries.  Carotid artery is widely patent bilaterally without significant stenosis or dissection.  Carotid bifurcation is widely patent.  Fenestration of the proximal basilar.  IMPRESSION: No significant acquired stenosis of the carotid or vertebral arteries.  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   Original Report Authenticated By: Janeece Riggers, M.D.    Mr Angiogram Neck W Wo Contrast  12/02/2011  *RADIOLOGY REPORT*  Clinical Data:  Stroke.  Hypertension and dyslipidemia.  MRI HEAD WITHOUT AND WITH CONTRAST MRA HEAD WITHOUT CONTRAST MRA NECK WITHOUT AND WITH CONTRAST  Technique:  Multiplanar, multiecho pulse sequences of the brain and surrounding structures were obtained without and with intravenous contrast.  Angiographic images of the Circle of Willis were obtained using MRA technique without intravenous contrast. Angiographic images of the neck were obtained using MRA technique without and with intravenous contrast.  Carotid stenosis measurements (when applicable) are obtained utilizing NASCET criteria, using the distal internal carotid diameter as the denominator.  Contrast: 13mL MULTIHANCE GADOBENATE DIMEGLUMINE 529 MG/ML IV SOLN  Comparison:  CT head 12/01/2011  MRI HEAD  Findings:  Negative for acute infarct.  Chronic lacunar infarction in the left thalamus.  Chronic microvascular ischemic changes in the cerebral white matter bilaterally.  Chronic microvascular ischemia in the pons.  Negative for hemorrhage or mass.  No fluid collection or midline shift.  Ventricle size is normal.  Age appropriate atrophy.  No midline shift.  IMPRESSION: Chronic microvascular ischemic changes in the left thalamus, pons, and cerebral white matter.  No acute infarct.  MRA HEAD  Findings:  Hypoplastic distal left vertebral artery with a very small lumen contributing to the basilar.  Right vertebral artery is widely patent.  PICA is patent bilaterally.  The basilar is patent bilaterally without stenosis.  Fetal origin of the right posterior cerebral artery with hypoplastic right P1 segment.  Superior cerebellar and posterior cerebral arteries are patent bilaterally. Fenestration of the proximal basilar artery is noted, a congenital variant.  Internal carotid artery is patent bilaterally without stenosis. Anterior and middle cerebral arteries are patent bilaterally without significant stenosis.  Negative for cerebral aneurysm.  IMPRESSION:  Congenital variations as above.  No significant acquired stenosis of the intracranial circulation.  MRA NECK  Findings: Right vertebral artery is dominant  and is widely patent to the basilar.  Left vertebral artery is   small and hypoplastic distal to the left PICA.  No significant stenosis and vertebral arteries.  Carotid artery is widely patent bilaterally without significant stenosis or dissection.  Carotid bifurcation is widely patent.  Fenestration of the proximal basilar.  IMPRESSION: No significant acquired stenosis of the carotid or vertebral arteries.  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   Original Report Authenticated By: Janeece Riggers, M.D.    Mr Brain Wo Contrast  12/02/2011  *RADIOLOGY REPORT*  Clinical Data:  Stroke.  Hypertension and dyslipidemia.  MRI HEAD WITHOUT AND WITH CONTRAST MRA HEAD WITHOUT CONTRAST MRA NECK WITHOUT AND WITH CONTRAST  Technique:  Multiplanar, multiecho pulse sequences of the brain and surrounding structures were obtained without and with intravenous contrast.  Angiographic images of the Circle of Willis were obtained using MRA technique without intravenous contrast. Angiographic images of the neck were obtained using MRA technique without and with intravenous contrast.   Carotid stenosis measurements (when applicable) are obtained utilizing NASCET criteria, using the distal internal carotid diameter as the denominator.  Contrast: 13mL MULTIHANCE GADOBENATE DIMEGLUMINE 529 MG/ML IV SOLN  Comparison:  CT head 12/01/2011  MRI HEAD  Findings:  Negative for acute infarct.  Chronic lacunar infarction in the left thalamus.  Chronic microvascular ischemic changes in the cerebral white matter bilaterally.  Chronic microvascular ischemia in the pons.  Negative for hemorrhage or mass.  No fluid collection or midline shift.  Ventricle size is normal.  Age appropriate atrophy.  No midline shift.  IMPRESSION: Chronic microvascular ischemic changes in the left thalamus, pons, and cerebral white matter.  No acute infarct.  MRA HEAD  Findings: Hypoplastic distal left vertebral artery with a very small lumen contributing to the basilar.  Right vertebral artery is widely patent.  PICA is patent bilaterally.  The basilar is patent bilaterally without stenosis.  Fetal origin of the right posterior cerebral artery with hypoplastic right P1 segment.  Superior cerebellar and posterior cerebral arteries are patent bilaterally. Fenestration of the proximal basilar artery is noted, a congenital variant.  Internal carotid artery is patent bilaterally without stenosis. Anterior and middle cerebral arteries are patent bilaterally without significant stenosis.  Negative for cerebral aneurysm.  IMPRESSION:  Congenital variations as above.  No  significant acquired stenosis of the intracranial circulation.  MRA NECK  Findings: Right vertebral artery is dominant  and is widely patent to the basilar.  Left vertebral artery is   small and hypoplastic distal to the left PICA.  No significant stenosis and vertebral arteries.  Carotid artery is widely patent bilaterally without significant stenosis or dissection.  Carotid bifurcation is widely patent.  Fenestration of the proximal basilar.  IMPRESSION: No significant  acquired stenosis of the carotid or vertebral arteries.  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   Original Report Authenticated By: Janeece Riggers, M.D.    Dg Chest Port 1 View  12/01/2011  *RADIOLOGY REPORT*  Clinical Data: Weakness  PORTABLE CHEST - 1 VIEW  Comparison: None.  Findings: Lungs are clear without infiltrate or effusion.  Negative for heart failure or pneumonia or mass lesion.  The heart size is mildly enlarged.  IMPRESSION: No active cardiopulmonary disease.   Original Report Authenticated By: Janeece Riggers, M.D.     Scheduled Meds:   . aspirin EC  81 mg Oral Daily  . calcium carbonate  1 tablet Oral BID WC  . levothyroxine  50 mcg Oral Daily  . magnesium oxide  400 mg Oral Daily  . potassium chloride SA  20 mEq Oral Daily  . simvastatin  10 mg Oral q1800  . sodium chloride  3 mL Intravenous Q12H  . [DISCONTINUED] calcium carbonate  600 mg Oral BID WC  . [DISCONTINUED] magnesium oxide  400 mg Oral Daily   Continuous Infusions:   . sodium chloride 75 mL/hr at 12/01/11 1826    Principal Problem:  *CVA (cerebral infarction) Active Problems:  High blood cholesterol  Hypertension  HTN (hypertension)  Dyslipidemia  Hypothyroid  Dizziness  Heart murmur    Time spent: 49    Stacey Stevens C  [facility] Pager [phone] If 8PM-8AM, please contact night-coverage at [email or web address], password [facility] 12/02/2011, 11:09 AM  LOS: 1 day

## 2011-12-02 NOTE — Evaluation (Signed)
SLP Cancellation Note  Patient Details Name: HEYDI DENNEHY MRN: [id number] DOB: 20-Jun-1928   Cancelled treatment:       Reason Eval/Treat Not Completed: Patient at procedure or test/unavailable   Donavan Burnet, MS [facility] SLP [phone]

## 2011-12-02 NOTE — Progress Notes (Signed)
  Echocardiogram 2D Echocardiogram has been performed.  Cathie Beams 12/02/2011, 10:38 AM

## 2011-12-02 NOTE — ED Provider Notes (Signed)
I saw and evaluated the patient, reviewed the resident's note and I agree with the findings and plan.  Pt comes in with cc of dizziness. Noted to have a left sided facial droop at arrival - which she was unaware of. Unknown last normal, but her dizziness started 1.5 hour before arrival. Family member reports that around noon, she was a little confused as well. No one knows when the droop started.  With unknown last normal and a NIHSS of 2 (all for droop), we didn't activate the stroke pager, but neurology was consulted immediately, and disposition made based on their recommendation.    Derwood Kaplan, MD 12/02/11 0110

## 2011-12-02 NOTE — Evaluation (Signed)
Physical Therapy Evaluation Patient Details Name: Stacey Stevens MRN: [id number] DOB: 02-Apr-1928 Today's Date: 12/02/2011 Time: 1478-2956 PT Time Calculation (min): 36 min  PT Assessment / Plan / Recommendation Clinical Impression  Patient is an 78 yo female admitted with dizziness, weakness, lt. facial weakness.  Patient now without symptoms and back to baseline mobility per patient.  Patient ambulated 300' with no assistive device with supervision only.  No acute PT needs identified - PT will sign off.  Encouraged patient to ambulate in hallway with nursing or family.    PT Assessment  Patent does not need any further PT services    Follow Up Recommendations  No PT follow up;Supervision/Assistance - 24 hour    Does the patient have the potential to tolerate intense rehabilitation      Barriers to Discharge        Equipment Recommendations  None recommended by PT    Recommendations for Other Services     Frequency      Precautions / Restrictions Precautions Precautions: None Restrictions Weight Bearing Restrictions: No   Pertinent Vitals/Pain       Mobility  Bed Mobility Bed Mobility: Not assessed Transfers Transfers: Sit to Stand;Stand to Sit Sit to Stand: 5: Supervision;With upper extremity assist;With armrests;From chair/3-in-1 Stand to Sit: 5: Supervision;With upper extremity assist;With armrests;To chair/3-in-1 Details for Transfer Assistance: No cues or assist needed.  Supervision for safety only. Ambulation/Gait Ambulation/Gait Assistance: 5: Supervision Ambulation Distance (Feet): 300 Feet (with one sitting rest break) Assistive device: None Ambulation/Gait Assistance Details: Patient with slow steady gait.  Good balance. Gait Pattern: Step-through pattern;Decreased stride length;Trunk flexed Gait velocity: Slow gait speed Stairs: No      PT Goals  N/A  Visit Information  Last PT Received On: 12/02/11 Assistance Needed: +1    Subjective Data  Subjective: "I think I'm back to normal" "They said it wasn't a stroke" Patient Stated Goal: To go home soon   Prior Functioning  Home Living Lives With: Son Available Help at Discharge: Family;Available 24 hours/day Type of Home: House Home Access: Stairs to enter [employer] of Steps: 1 Entrance Stairs-Rails: None Home Layout: One level Bathroom Shower/Tub: Engineer, manufacturing systems: Standard Bathroom Accessibility: Yes How Accessible: Accessible via walker Home Adaptive Equipment: None Prior Function Level of Independence: Independent Able to Take Stairs?: Yes Driving: Yes Vocation: Retired Musician: No difficulties    Cognition  Overall Cognitive Status: Appears within functional limits for tasks assessed/performed Arousal/Alertness: Awake/alert Orientation Level: Oriented X4 / Intact Behavior During Session: [facility] for tasks performed    Extremity/Trunk Assessment Right Upper Extremity Assessment RUE ROM/Strength/Tone: [facility] for tasks assessed Left Upper Extremity Assessment LUE ROM/Strength/Tone: WFL for tasks assessed Right Lower Extremity Assessment RLE ROM/Strength/Tone: WFL for tasks assessed RLE Sensation: WFL - Light Touch Left Lower Extremity Assessment LLE ROM/Strength/Tone: WFL for tasks assessed LLE Sensation: WFL - Light Touch Trunk Assessment Trunk Assessment: Kyphotic   Balance Balance Balance Assessed: Yes High Level Balance High Level Balance Activites: Direction changes;Turns;Sudden stops;Head turns High Level Balance Comments: No loss of balance with high level balance activities  End of Session PT - End of Session Activity Tolerance: Patient tolerated treatment well Patient left: in chair;with call bell/phone within reach Nurse Communication: Mobility status (Encouraged ambulation in hallway with nursing/family)  GP     Vena Austria 12/02/2011, 4:57 PM Durenda Hurt. Renaldo Fiddler, [facility] Acute Rehab Services Pager  [phone]

## 2011-12-03 DIAGNOSIS — E869 Volume depletion, unspecified: Secondary | ICD-10-CM

## 2011-12-03 DIAGNOSIS — E039 Hypothyroidism, unspecified: Secondary | ICD-10-CM

## 2011-12-03 DIAGNOSIS — I959 Hypotension, unspecified: Principal | ICD-10-CM

## 2011-12-03 LAB — CBC
HCT: 30.7 % — ABNORMAL LOW (ref 36.0–46.0)
MCHC: 33.9 g/dL (ref 30.0–36.0)
Platelets: 181 10*3/uL (ref 150–400)
RDW: 12.6 % (ref 11.5–15.5)
WBC: 12.9 10*3/uL — ABNORMAL HIGH (ref 4.0–10.5)

## 2011-12-03 LAB — BASIC METABOLIC PANEL
BUN: 14 mg/dL (ref 6–23)
GFR calc Af Amer: >90 mL/min (ref >90)
GFR calc non Af Amer: 82 mL/min — ABNORMAL LOW (ref >90)
Potassium: 4.8 mEq/L (ref 3.5–5.1)
Sodium: 138 mEq/L (ref 135–145)

## 2011-12-03 MED ORDER — AMLODIPINE BESYLATE 5 MG PO TABS: 5.0000 mg | ORAL_TABLET | Freq: Every day | ORAL | Status: AC

## 2011-12-03 NOTE — ED Provider Notes (Signed)
I saw and evaluated the patient, reviewed the resident's note and I agree with the findings and plan.  Pt comes in with dizziness that started about 1.5 hours prior to arrival. Noted to have droop. Unknown last normal, as patient didn't even know that she had a droop. Neuro consulted immediately, but stroke pager was not activated. Expectant care per Neurology, likely a stroke.  Derwood Kaplan, MD 12/03/11 1727

## 2011-12-03 NOTE — Plan of Care (Signed)
Problem: Discharge Progression Outcomes Goal: Barriers To Progression Addressed/Resolved Outcome: Adequate for Discharge No barriers to discharge for patient.  Patient is alert and oriented; aware of s/s of any further cva presentation. Goal: NIHSS documented on discharge Outcome: Completed/Met Date Met:  12/03/11 Patient NIH was one on discharge. Goal: Discharge plan in place and appropriate Outcome: Completed/Met Date Met:  12/03/11 Patient to be discharged to home with family supervision. Goal: Pain controlled with appropriate interventions Outcome: Completed/Met Date Met:  12/03/11 Patient denies any pain with assessment prior to discharge. Goal: Tolerating diet/verbalizes dietary restrictions Outcome: Completed/Met Date Met:  12/03/11 Patient eating diet similar to diet eaten at home.

## 2011-12-03 NOTE — Plan of Care (Signed)
Problem: Phase III Progression Outcomes Goal: Neuro status stablized/improved Outcome: Completed/Met Date Met:  12/03/11 Patient has some unsteadiness when up ambulating but is minimal stand by assist. Goal: Rehab Team goals identified Outcome: Completed/Met Date Met:  12/03/11 Patient neurological deficits resolved and has no need for additional strengthening or skills retraining. Goal: Anticoagulation Therapy per MD order Outcome: Adequate for Discharge Patient will be discharged home on ASA as per home routine prior to admission.

## 2011-12-03 NOTE — Discharge Summary (Signed)
Physician Discharge Summary  Stacey Stevens WUJ:[id number] DOB: [birth year] DOA: 12/01/2011  PCP: Stacey Stabile, MD  Admit date: 12/01/2011 Discharge date: 12/03/2011  Time spent: 30 minutes  Recommendations for Outpatient Follow-up:      Follow-up Information    Follow up with Stacey Stabile, MD. (next week, call appt upon discharge)    Contact information:   [facility] [address] [place] [address] [phone]            Discharge Diagnoses:  Principal Problem:  *Dizziness/left facial weakness Hypotension Active Problems:  High blood cholesterol  Hypertension  HTN (hypertension)  Dyslipidemia  Hypothyroid  Dizziness  Heart murmur   Discharge Condition: Improved/stable  Diet recommendation: heart healthy  Filed Weights   12/01/11 1950 12/03/11 0627  Weight: 63.504 kg (140 lb) 65.2 kg (143 lb 11.8 oz)    History of present illness:  Stacey Stevens is a 78 y.o. female, who has history of hypothyroidism, hypertension, dyslipidemia, heart murmur who was in her usual state of health and spending time with family over Thanksgiving dinner, patient says she was in her kitchen when she started feeling poorly, she had a sensation of going to the bathroom and as she was climbing her stairs she started feeling dizzy and her vision got blurred, family members thought that she was having a stroke and they called 911, when EMS arrived blood pressure was found to be low, she was brought to the ER where after some IV fluids blood pressure was stabilized, patient's initial presentation showed left-sided facial weakness, initial workup showed slightly low sodium along with CT evidence of age unclear stroke.  She was seen by neurologist who suggested the internal medicine admission, at present time patient is readily symptom-free, she is feeling close to her baseline, her exam reveals mild left-sided facial weakness otherwise  unremarkable exam.   Hospital Course:  1. Left-sided facial weakness, dizziness, blurry vision - possibly due to hypoperfusion from transient hypotension. Upon admission patient had imaging studies-a CT scan of head which showed age indeterminate left thalamic lacunar infarcts are, an MRI was done and came back negative for infarct. She had a carotid Doppler is done which showed 40-59% mid ICA stenosis with no evidence of ICA stenosis on the left. Dr. Holli Stevens followed up with patient and his impression was that she more likely had a syncopal episode secondary to hypotension. She was monitored on telemetry and had no arrhythmias in the hospital, echo was done and the results are below with EF of 55-60% and no embolic source reported.Patient had been on antihypertensives including HCTZ and was hyponatremic on admission in the etiology of the hypotension is likely to be hypovolemia/dehydration. Her antihypertensives were held in the hospital and she is remained hemodynamically stable. She is to followup with her PCP for further monitoring of her blood pressures and management as clinically appropriate. Patient was seen by PT OT and no further skilled needs identified, 24 /7 supervision/assistance recommended and patient's son states he will be there to provide this. 2. Volume depletion -As discussed above, Resolved with hydration 3. Hyponatremia . As above secondary to dehydration/volume depletion. She was also on HCTZ which was discontinued and she has been instructed to stay off it up upon discharge followup with her PCP. Resolved with hydration 4. History of Hypertension.  -Patient was hypotensive initially and this Resolved with fluid bolus and holding off antihypertensives. She remained afebrile with no evidence of infection. She states that she  checks her blood pressures at home and she's been given parameters with which to restart the Norvasc, she is to stay off the lisinopril/HCTZ as  discussed above. She is to followup with her PCP 5. History of dyslipidemia and hypothyroidism. No acute issues home medications to be continued include statin and Synthroid.  6. Hypokalemia-resolved, potassium replaced in the hospital    Procedures:  2-D echocardiogram The cavity size was normal. Wall thickness was increased in a pattern of mild LVH. Systolic function was normal. The estimated ejection fraction was in the range of 55% to 60%. Doppler parameters are consistent with abnormal left ventricular relaxation (grade 1 diastolic dysfunction).    Carotid Doppler ultrasound Right 40% to 59% Mid ICA stenosis lowest end of range by velocity plaque morphology does not support the increase. Probably due to tortuosity. Left : No evidence of ICA stenosis. Bilateral : Vertebral artery flow is antegrade  Consultations:  Neurology-Dr. Amada Stevens  Discharge Exam: Filed Vitals:   12/02/11 1616 12/02/11 1822 12/02/11 2118 12/03/11 0627  BP: 123/55 118/67 119/49 106/48  Pulse: 82 90 85 70  Temp:  98.3 F (36.8 C) 99.5 F (37.5 C) 98 F (36.7 C)  TempSrc:  Oral Oral Oral  Resp:  18 18 20   Height:      Weight:    65.2 kg (143 lb 11.8 oz)  SpO2:  100% 97% 100%   Exam:  General: Alert and oriented x3, in no apparent distress  Cardiovascular: Regular rate and rhythm, normal S1-S2  Respiratory: Clear to auscultation bilaterally  Abdomen: Soft, bowel sounds present nontender nondistended no organomegaly no masses palpable     Discharge Instructions  Discharge Orders    Future Orders Please Complete By Expires   Diet - low sodium heart healthy      Increase activity slowly          Medication List     As of 12/03/2011 10:19 AM    STOP taking these medications         losartan-hydrochlorothiazide 50-12.5 MG per tablet   Commonly known as: HYZAAR      potassium chloride SA 20 MEQ tablet   Commonly known as: K-DUR,KLOR-CON      raloxifene 60 MG tablet   Commonly  known as: EVISTA      TAKE these medications         amLODipine 5 MG tablet   Commonly known as: NORVASC   Take 1 tablet (5 mg total) by mouth daily.      aspirin EC 81 MG tablet   Take 81 mg by mouth daily.      calcium carbonate 600 MG Tabs   Commonly known as: OS-CAL   Take 600 mg by mouth 2 (two) times daily with a meal.      levothyroxine 50 MCG tablet   Commonly known as: SYNTHROID, LEVOTHROID   Take 50 mcg by mouth daily.      lovastatin 20 MG tablet   Commonly known as: MEVACOR   Take 20 mg by mouth at bedtime.      magnesium oxide 400 MG tablet   Commonly known as: MAG-OX   Take 400 mg by mouth daily.      multivitamin with minerals Tabs   Take 1 tablet by mouth daily.           Follow-up Information    Follow up with Stacey Stabile, MD. (next week, call appt upon discharge)    Contact information:   [facility] [address] [place] [address] [phone]           The results of significant diagnostics from this hospitalization (including imaging, microbiology, ancillary and laboratory) are listed below for reference.    Significant Diagnostic Studies: Ct Head Wo Contrast  12/01/2011  *RADIOLOGY REPORT*  Clinical Data: 78 year old female with left facial droop.  CT HEAD WITHOUT CONTRAST  Technique:  Contiguous axial images were obtained from the base of the skull through the vertex without contrast.  Comparison: None  Findings: An age indeterminate lacunar infarct within the left thalamus is noted. Mild chronic small vessel white matter ischemic changes are present.  No acute intracranial abnormalities are identified, including mass lesion or mass effect, hydrocephalus, extra-axial fluid collection, midline shift or hemorrhage.  The visualized bony calvarium is unremarkable.  IMPRESSION: Age indeterminate left thalamic lacunar infarct.  No evidence of hemorrhage.  Mild chronic small vessel white matter  ischemic changes.   Original Report Authenticated By: Harmon Pier, M.D.    Mr [facility] Wo Contrast  12/02/2011  *RADIOLOGY REPORT*  Clinical Data:  Stroke.  Hypertension and dyslipidemia.  MRI HEAD WITHOUT AND WITH CONTRAST MRA HEAD WITHOUT CONTRAST MRA NECK WITHOUT AND WITH CONTRAST  Technique:  Multiplanar, multiecho pulse sequences of the brain and surrounding structures were obtained without and with intravenous contrast.  Angiographic images of the Circle of Willis were obtained using MRA technique without intravenous contrast. Angiographic images of the neck were obtained using MRA technique without and with intravenous contrast.  Carotid stenosis measurements (when applicable) are obtained utilizing NASCET criteria, using the distal internal carotid diameter as the denominator.  Contrast: 13mL MULTIHANCE GADOBENATE DIMEGLUMINE 529 MG/ML IV SOLN  Comparison:  CT head 12/01/2011  MRI HEAD  Findings:  Negative for acute infarct.  Chronic lacunar infarction in the left thalamus.  Chronic microvascular ischemic changes in the cerebral white matter bilaterally.  Chronic microvascular ischemia in the pons.  Negative for hemorrhage or mass.  No fluid collection or midline shift.  Ventricle size is normal.  Age appropriate atrophy.  No midline shift.  IMPRESSION: Chronic microvascular ischemic changes in the left thalamus, pons, and cerebral white matter.  No acute infarct.  MRA HEAD  Findings: Hypoplastic distal left vertebral artery with a very small lumen contributing to the basilar.  Right vertebral artery is widely patent.  PICA is patent bilaterally.  The basilar is patent bilaterally without stenosis.  Fetal origin of the right posterior cerebral artery with hypoplastic right P1 segment.  Superior cerebellar and posterior cerebral arteries are patent bilaterally. Fenestration of the proximal basilar artery is noted, a congenital variant.  Internal carotid artery is patent bilaterally without stenosis.  Anterior and middle cerebral arteries are patent bilaterally without significant stenosis.  Negative for cerebral aneurysm.  IMPRESSION:  Congenital variations as above.  No significant acquired stenosis of the intracranial circulation.  MRA NECK  Findings: Right vertebral artery is dominant  and is widely patent to the basilar.  Left vertebral artery is   small and hypoplastic distal to the left PICA.  No significant stenosis and vertebral arteries.  Carotid artery is widely patent bilaterally without significant stenosis or dissection.  Carotid bifurcation is widely patent.  Fenestration of the proximal basilar.  IMPRESSION: No significant acquired stenosis of the carotid or vertebral arteries.  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   Original Report Authenticated By: Janeece Riggers, M.D.  Mr Angiogram Neck W Wo Contrast  12/02/2011  *RADIOLOGY REPORT*  Clinical Data:  Stroke.  Hypertension and dyslipidemia.  MRI HEAD WITHOUT AND WITH CONTRAST MRA HEAD WITHOUT CONTRAST MRA NECK WITHOUT AND WITH CONTRAST  Technique:  Multiplanar, multiecho pulse sequences of the brain and surrounding structures were obtained without and with intravenous contrast.  Angiographic images of the Circle of Willis were obtained using MRA technique without intravenous contrast. Angiographic images of the neck were obtained using MRA technique without and with intravenous contrast.  Carotid stenosis measurements (when applicable) are obtained utilizing NASCET criteria, using the distal internal carotid diameter as the denominator.  Contrast: 13mL MULTIHANCE GADOBENATE DIMEGLUMINE 529 MG/ML IV SOLN  Comparison:  CT head 12/01/2011  MRI HEAD  Findings:  Negative for acute infarct.  Chronic lacunar infarction in the left thalamus.  Chronic microvascular ischemic changes in the cerebral white matter bilaterally.  Chronic microvascular ischemia in the pons.  Negative for hemorrhage or mass.  No  fluid collection or midline shift.  Ventricle size is normal.  Age appropriate atrophy.  No midline shift.  IMPRESSION: Chronic microvascular ischemic changes in the left thalamus, pons, and cerebral white matter.  No acute infarct.  MRA HEAD  Findings: Hypoplastic distal left vertebral artery with a very small lumen contributing to the basilar.  Right vertebral artery is widely patent.  PICA is patent bilaterally.  The basilar is patent bilaterally without stenosis.  Fetal origin of the right posterior cerebral artery with hypoplastic right P1 segment.  Superior cerebellar and posterior cerebral arteries are patent bilaterally. Fenestration of the proximal basilar artery is noted, a congenital variant.  Internal carotid artery is patent bilaterally without stenosis. Anterior and middle cerebral arteries are patent bilaterally without significant stenosis.  Negative for cerebral aneurysm.  IMPRESSION:  Congenital variations as above.  No significant acquired stenosis of the intracranial circulation.  MRA NECK  Findings: Right vertebral artery is dominant  and is widely patent to the basilar.  Left vertebral artery is   small and hypoplastic distal to the left PICA.  No significant stenosis and vertebral arteries.  Carotid artery is widely patent bilaterally without significant stenosis or dissection.  Carotid bifurcation is widely patent.  Fenestration of the proximal basilar.  IMPRESSION: No significant acquired stenosis of the carotid or vertebral arteries.  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   Original Report Authenticated By: Janeece Riggers, M.D.    Mr Brain Wo Contrast  12/02/2011  *RADIOLOGY REPORT*  Clinical Data:  Stroke.  Hypertension and dyslipidemia.  MRI HEAD WITHOUT AND WITH CONTRAST MRA HEAD WITHOUT CONTRAST MRA NECK WITHOUT AND WITH CONTRAST  Technique:  Multiplanar, multiecho pulse sequences of the brain and surrounding structures were obtained  without and with intravenous contrast.  Angiographic images of the Circle of Willis were obtained using MRA technique without intravenous contrast. Angiographic images of the neck were obtained using MRA technique without and with intravenous contrast.  Carotid stenosis measurements (when applicable) are obtained utilizing NASCET criteria, using the distal internal carotid diameter as the denominator.  Contrast: 13mL MULTIHANCE GADOBENATE DIMEGLUMINE 529 MG/ML IV SOLN  Comparison:  CT head 12/01/2011  MRI HEAD  Findings:  Negative for acute infarct.  Chronic lacunar infarction in the left thalamus.  Chronic microvascular ischemic changes in the cerebral white matter bilaterally.  Chronic microvascular ischemia in the pons.  Negative for hemorrhage or mass.  No fluid collection or midline shift.  Ventricle size is normal.  Age appropriate atrophy.  No midline shift.  IMPRESSION: Chronic microvascular ischemic changes in the left thalamus, pons, and cerebral white matter.  No acute infarct.  MRA HEAD  Findings: Hypoplastic distal left vertebral artery with a very small lumen contributing to the basilar.  Right vertebral artery is widely patent.  PICA is patent bilaterally.  The basilar is patent bilaterally without stenosis.  Fetal origin of the right posterior cerebral artery with hypoplastic right P1 segment.  Superior cerebellar and posterior cerebral arteries are patent bilaterally. Fenestration of the proximal basilar artery is noted, a congenital variant.  Internal carotid artery is patent bilaterally without stenosis. Anterior and middle cerebral arteries are patent bilaterally without significant stenosis.  Negative for cerebral aneurysm.  IMPRESSION:  Congenital variations as above.  No significant acquired stenosis of the intracranial circulation.  MRA NECK  Findings: Right vertebral artery is dominant  and is widely patent to the basilar.  Left vertebral artery is   small and hypoplastic distal to the left  PICA.  No significant stenosis and vertebral arteries.  Carotid artery is widely patent bilaterally without significant stenosis or dissection.  Carotid bifurcation is widely patent.  Fenestration of the proximal basilar.  IMPRESSION: No significant acquired stenosis of the carotid or vertebral arteries.  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   Original Report Authenticated By: Janeece Riggers, M.D.    Dg Chest Port 1 View  12/01/2011  *RADIOLOGY REPORT*  Clinical Data: Weakness  PORTABLE CHEST - 1 VIEW  Comparison: None.  Findings: Lungs are clear without infiltrate or effusion.  Negative for heart failure or pneumonia or mass lesion.  The heart size is mildly enlarged.  IMPRESSION: No active cardiopulmonary disease.   Original Report Authenticated By: Janeece Riggers, M.D.     Microbiology: Recent Results (from the past 240 hour(s))  URINE CULTURE     Status: Normal   Collection Time   12/01/11  8:40 PM      Component Value Range Status Comment   Specimen Description URINE, RANDOM   Final    Special Requests NONE   Final    Culture  Setup Time 12/01/2011 22:00   Final    Colony Count 20,OOO COLONIES/ML   Final    Culture     Final    Value: Multiple bacterial morphotypes present, none predominant. Suggest appropriate recollection if clinically indicated.   Report Status 12/02/2011 FINAL   Final      Labs: Basic Metabolic Panel:  Lab 12/03/11 1610 12/02/11 0545 12/01/11 1635  NA 138 129* 128*  K 4.8 2.8* 3.8  CL 105 95* 91*  CO2 26 26 25   GLUCOSE 99 112* 123*  BUN 14 18 25*  CREATININE 0.63 0.62 1.06  CALCIUM 8.6 8.3* 9.6  MG -- -- --  PHOS -- -- --   Liver Function Tests: No results found for this basename: AST:5,ALT:5,ALKPHOS:5,BILITOT:5,PROT:5,ALBUMIN:5 in the last 168 hours No results found for this basename: LIPASE:5,AMYLASE:5 in the last 168 hours No results found for this basename: AMMONIA:5 in the last 168 hours CBC:  Lab  12/03/11 0500 12/02/11 0545 12/01/11 1635  WBC 12.9* 13.7* 9.2  NEUTROABS -- -- --  HGB 10.4* 9.6* 11.5*  HCT 30.7* 27.4* 33.8*  MCV 88.0 84.8 85.1  PLT 181 174 224   Cardiac Enzymes:  Lab 12/01/11 1635  CKTOTAL --  CKMB --  CKMBINDEX --  TROPONINI <0.30   BNP: BNP (last 3 results) No results  found for this basename: PROBNP:3 in the last 8760 hours CBG:  Lab 12/01/11 1552  GLUCAP 128*       Signed:  Verba Ainley C  [facility] 12/03/2011, 10:19 AM

## 2011-12-03 NOTE — Plan of Care (Signed)
Problem: Phase I Progression Outcomes Goal: Antithrombotic given by end of Day 2 Outcome: Completed/Met Date Met:  12/03/11 Patient given ASA by day 2. Goal: Pain controlled with appropriate interventions Outcome: Completed/Met Date Met:  12/03/11 Denies any pain with assessment this am. Goal: Hemodynamically stable Outcome: Completed/Met Date Met:  12/03/11 Labwork unremarkable. Goal: BP within ordered parameters Outcome: Completed/Met Date Met:  12/03/11 BP noted within stated parameters.

## 2011-12-03 NOTE — Progress Notes (Signed)
Order received, chart reviewed, here to see pt for eval and pt in room, but has been D/C'd. Pt was at a S level with PT and her their note she has 24/7 S/A, so more than likely no issues from an OT standpoint as well except for the need for S. Eval not completed.

## 2011-12-03 NOTE — Plan of Care (Signed)
Problem: Phase II Progression Outcomes Goal: Monitor D/C'd if no arrhythmias x 24 hrs Outcome: Adequate for Discharge Patient has asymptomatic primary heart block. Goal: Nutritional status adequate Outcome: Completed/Met Date Met:  12/03/11 Patient eating per her normal routine; no complaints of decreased appetite noted. Goal: Discharge plan established Outcome: Adequate for Discharge Patient discharging to home with assistance from family.

## 2011-12-03 NOTE — Progress Notes (Signed)
Subjective: No acute events overnight. MRI negative for stroke.   Exam: Filed Vitals:   12/03/11 1027  BP: 132/58  Pulse: 77  Temp: 98.1 F (36.7 C)  Resp: 20   Gen: In bed, NAD MS: Awake, Alert, NAD [id number], left sided facial droop has resolved., VFF Motor: 5/5 throughout.  Sensory:Intact to LT.    Impression: 78 yo F with transient facial droop in the setting of syncope with hypotension. I suspect that she may have had transient stunned cerebrum causing the facial droop, but do not feel that the tiology is thrombotic or embolic, but rather hypotension and therefore will not change antiplatelet medications. Primary team suspects dehydration secondary to hctz. Echo was normal as was MRA.   Recommendations: 1) Syncope workup per medicine team 2) Neurology will sign off at this time, please page with any further questions.   Ritta Slot, MD [facility] [phone]  If 7pm- 7am, please page neurology on call at [phone].

## 2011-12-03 NOTE — Plan of Care (Signed)
Problem: Phase III Progression Outcomes Goal: Pt without medical complications Outcome: Completed/Met Date Met:  12/03/11 Patient has no treatable co morbities. Goal: Rehab Team goals identified Outcome: Adequate for Discharge Patient has PT signed off. Goal: Able to participate in therapies Outcome: Adequate for Discharge Patient signed off by PT; able to ambulate with stand by assist. Goal: Bowel program established Outcome: Adequate for Discharge Patient states no variant from routine at home.

## 2012-08-14 ENCOUNTER — Other Ambulatory Visit: Payer: Self-pay

## 2012-08-14 DIAGNOSIS — Z1231 Encounter for screening mammogram for malignant neoplasm of breast: Secondary | ICD-10-CM

## 2012-08-28 ENCOUNTER — Ambulatory Visit
Admission: RE | Admit: 2012-08-28 | Discharge: 2012-08-28 | Disposition: A | Discharge status: Home / Self Care | Payer: No Typology Code available for payment source | Source: Ambulatory Visit

## 2012-08-28 DIAGNOSIS — Z1231 Encounter for screening mammogram for malignant neoplasm of breast: Secondary | ICD-10-CM

## 2013-10-16 ENCOUNTER — Other Ambulatory Visit: Payer: Self-pay

## 2013-10-16 DIAGNOSIS — Z1239 Encounter for other screening for malignant neoplasm of breast: Secondary | ICD-10-CM

## 2013-10-24 ENCOUNTER — Other Ambulatory Visit: Payer: Self-pay

## 2013-10-24 DIAGNOSIS — Z1231 Encounter for screening mammogram for malignant neoplasm of breast: Secondary | ICD-10-CM

## 2013-10-29 ENCOUNTER — Ambulatory Visit
Admission: RE | Admit: 2013-10-29 | Discharge: 2013-10-29 | Disposition: A | Discharge status: Home / Self Care | Payer: Commercial Managed Care - HMO | Source: Ambulatory Visit

## 2013-10-29 DIAGNOSIS — Z1231 Encounter for screening mammogram for malignant neoplasm of breast: Secondary | ICD-10-CM

## 2015-02-16 DIAGNOSIS — L309 Dermatitis, unspecified: Secondary | ICD-10-CM | POA: Diagnosis not present

## 2015-02-16 DIAGNOSIS — M8580 Other specified disorders of bone density and structure, unspecified site: Secondary | ICD-10-CM | POA: Diagnosis not present

## 2015-02-16 DIAGNOSIS — E7800 Pure hypercholesterolemia, unspecified: Secondary | ICD-10-CM | POA: Diagnosis not present

## 2015-02-16 DIAGNOSIS — E039 Hypothyroidism, unspecified: Secondary | ICD-10-CM | POA: Diagnosis not present

## 2015-02-16 DIAGNOSIS — I10 Essential (primary) hypertension: Secondary | ICD-10-CM | POA: Diagnosis not present

## 2015-02-16 DIAGNOSIS — M1990 Unspecified osteoarthritis, unspecified site: Secondary | ICD-10-CM | POA: Diagnosis not present

## 2015-04-07 ENCOUNTER — Other Ambulatory Visit: Payer: Self-pay

## 2015-04-07 DIAGNOSIS — Z1231 Encounter for screening mammogram for malignant neoplasm of breast: Secondary | ICD-10-CM

## 2015-04-27 ENCOUNTER — Ambulatory Visit
Admission: RE | Admit: 2015-04-27 | Discharge: 2015-04-27 | Disposition: A | Discharge status: Home / Self Care | Payer: PPO | Source: Ambulatory Visit

## 2015-04-27 DIAGNOSIS — Z1231 Encounter for screening mammogram for malignant neoplasm of breast: Secondary | ICD-10-CM

## 2015-08-12 DIAGNOSIS — I10 Essential (primary) hypertension: Secondary | ICD-10-CM | POA: Diagnosis not present

## 2015-08-12 DIAGNOSIS — M8580 Other specified disorders of bone density and structure, unspecified site: Secondary | ICD-10-CM | POA: Diagnosis not present

## 2015-08-12 DIAGNOSIS — E7800 Pure hypercholesterolemia, unspecified: Secondary | ICD-10-CM | POA: Diagnosis not present

## 2015-08-12 DIAGNOSIS — M1990 Unspecified osteoarthritis, unspecified site: Secondary | ICD-10-CM | POA: Diagnosis not present

## 2015-08-12 DIAGNOSIS — R1013 Epigastric pain: Secondary | ICD-10-CM | POA: Diagnosis not present

## 2015-08-12 DIAGNOSIS — E039 Hypothyroidism, unspecified: Secondary | ICD-10-CM | POA: Diagnosis not present

## 2015-09-24 DIAGNOSIS — M8588 Other specified disorders of bone density and structure, other site: Secondary | ICD-10-CM | POA: Diagnosis not present

## 2015-09-24 DIAGNOSIS — M810 Age-related osteoporosis without current pathological fracture: Secondary | ICD-10-CM | POA: Diagnosis not present

## 2015-10-13 DIAGNOSIS — H2513 Age-related nuclear cataract, bilateral: Secondary | ICD-10-CM | POA: Diagnosis not present

## 2015-10-13 DIAGNOSIS — H5203 Hypermetropia, bilateral: Secondary | ICD-10-CM | POA: Diagnosis not present

## 2015-10-13 DIAGNOSIS — H40011 Open angle with borderline findings, low risk, right eye: Secondary | ICD-10-CM | POA: Diagnosis not present

## 2015-10-13 DIAGNOSIS — H40012 Open angle with borderline findings, low risk, left eye: Secondary | ICD-10-CM | POA: Diagnosis not present

## 2016-02-12 DIAGNOSIS — I10 Essential (primary) hypertension: Secondary | ICD-10-CM | POA: Diagnosis not present

## 2016-02-12 DIAGNOSIS — M8580 Other specified disorders of bone density and structure, unspecified site: Secondary | ICD-10-CM | POA: Diagnosis not present

## 2016-02-12 DIAGNOSIS — E039 Hypothyroidism, unspecified: Secondary | ICD-10-CM | POA: Diagnosis not present

## 2016-02-12 DIAGNOSIS — M1990 Unspecified osteoarthritis, unspecified site: Secondary | ICD-10-CM | POA: Diagnosis not present

## 2016-02-12 DIAGNOSIS — E7800 Pure hypercholesterolemia, unspecified: Secondary | ICD-10-CM | POA: Diagnosis not present

## 2016-07-22 ENCOUNTER — Other Ambulatory Visit: Payer: Self-pay | Admitting: Family Medicine

## 2016-07-22 DIAGNOSIS — Z1231 Encounter for screening mammogram for malignant neoplasm of breast: Secondary | ICD-10-CM

## 2016-08-02 ENCOUNTER — Ambulatory Visit
Admission: RE | Admit: 2016-08-02 | Discharge: 2016-08-02 | Disposition: A | Discharge status: Home / Self Care | Payer: PPO | Source: Ambulatory Visit | Attending: Family Medicine | Admitting: Family Medicine

## 2016-08-02 DIAGNOSIS — Z1231 Encounter for screening mammogram for malignant neoplasm of breast: Secondary | ICD-10-CM | POA: Diagnosis not present

## 2016-08-15 DIAGNOSIS — E7800 Pure hypercholesterolemia, unspecified: Secondary | ICD-10-CM | POA: Diagnosis not present

## 2016-08-15 DIAGNOSIS — M8580 Other specified disorders of bone density and structure, unspecified site: Secondary | ICD-10-CM | POA: Diagnosis not present

## 2016-08-15 DIAGNOSIS — M1990 Unspecified osteoarthritis, unspecified site: Secondary | ICD-10-CM | POA: Diagnosis not present

## 2016-08-15 DIAGNOSIS — I10 Essential (primary) hypertension: Secondary | ICD-10-CM | POA: Diagnosis not present

## 2016-08-15 DIAGNOSIS — E039 Hypothyroidism, unspecified: Secondary | ICD-10-CM | POA: Diagnosis not present

## 2016-10-17 DIAGNOSIS — H40013 Open angle with borderline findings, low risk, bilateral: Secondary | ICD-10-CM | POA: Diagnosis not present

## 2016-10-17 DIAGNOSIS — H524 Presbyopia: Secondary | ICD-10-CM | POA: Diagnosis not present

## 2016-10-17 DIAGNOSIS — H2513 Age-related nuclear cataract, bilateral: Secondary | ICD-10-CM | POA: Diagnosis not present

## 2017-01-19 DIAGNOSIS — H25812 Combined forms of age-related cataract, left eye: Secondary | ICD-10-CM | POA: Diagnosis not present

## 2017-01-19 DIAGNOSIS — H2512 Age-related nuclear cataract, left eye: Secondary | ICD-10-CM | POA: Diagnosis not present

## 2017-02-02 DIAGNOSIS — H2511 Age-related nuclear cataract, right eye: Secondary | ICD-10-CM | POA: Diagnosis not present

## 2017-02-02 DIAGNOSIS — H25811 Combined forms of age-related cataract, right eye: Secondary | ICD-10-CM | POA: Diagnosis not present

## 2017-02-20 DIAGNOSIS — E7800 Pure hypercholesterolemia, unspecified: Secondary | ICD-10-CM | POA: Diagnosis not present

## 2017-02-20 DIAGNOSIS — I10 Essential (primary) hypertension: Secondary | ICD-10-CM | POA: Diagnosis not present

## 2017-02-20 DIAGNOSIS — M8580 Other specified disorders of bone density and structure, unspecified site: Secondary | ICD-10-CM | POA: Diagnosis not present

## 2017-02-20 DIAGNOSIS — L309 Dermatitis, unspecified: Secondary | ICD-10-CM | POA: Diagnosis not present

## 2017-02-20 DIAGNOSIS — E039 Hypothyroidism, unspecified: Secondary | ICD-10-CM | POA: Diagnosis not present

## 2017-02-20 DIAGNOSIS — M1990 Unspecified osteoarthritis, unspecified site: Secondary | ICD-10-CM | POA: Diagnosis not present

## 2017-03-03 DIAGNOSIS — Z961 Presence of intraocular lens: Secondary | ICD-10-CM | POA: Diagnosis not present

## 2017-08-21 DIAGNOSIS — E7800 Pure hypercholesterolemia, unspecified: Secondary | ICD-10-CM | POA: Diagnosis not present

## 2017-08-21 DIAGNOSIS — I10 Essential (primary) hypertension: Secondary | ICD-10-CM | POA: Diagnosis not present

## 2017-08-21 DIAGNOSIS — E039 Hypothyroidism, unspecified: Secondary | ICD-10-CM | POA: Diagnosis not present

## 2017-08-21 DIAGNOSIS — M8580 Other specified disorders of bone density and structure, unspecified site: Secondary | ICD-10-CM | POA: Diagnosis not present

## 2017-08-21 DIAGNOSIS — M1990 Unspecified osteoarthritis, unspecified site: Secondary | ICD-10-CM | POA: Diagnosis not present

## 2017-08-21 DIAGNOSIS — L309 Dermatitis, unspecified: Secondary | ICD-10-CM | POA: Diagnosis not present

## 2017-11-23 ENCOUNTER — Other Ambulatory Visit: Payer: Self-pay | Admitting: Family Medicine

## 2017-11-23 DIAGNOSIS — Z1231 Encounter for screening mammogram for malignant neoplasm of breast: Secondary | ICD-10-CM

## 2017-12-21 ENCOUNTER — Ambulatory Visit
Admission: RE | Admit: 2017-12-21 | Discharge: 2017-12-21 | Disposition: A | Discharge status: Home / Self Care | Payer: PPO | Source: Ambulatory Visit | Attending: Family Medicine | Admitting: Family Medicine

## 2017-12-21 DIAGNOSIS — Z1231 Encounter for screening mammogram for malignant neoplasm of breast: Secondary | ICD-10-CM

## 2018-02-21 ENCOUNTER — Other Ambulatory Visit: Payer: Self-pay | Admitting: Family Medicine

## 2018-02-21 DIAGNOSIS — I10 Essential (primary) hypertension: Secondary | ICD-10-CM | POA: Diagnosis not present

## 2018-02-21 DIAGNOSIS — M810 Age-related osteoporosis without current pathological fracture: Secondary | ICD-10-CM

## 2018-02-21 DIAGNOSIS — L309 Dermatitis, unspecified: Secondary | ICD-10-CM | POA: Diagnosis not present

## 2018-02-21 DIAGNOSIS — E7800 Pure hypercholesterolemia, unspecified: Secondary | ICD-10-CM | POA: Diagnosis not present

## 2018-02-21 DIAGNOSIS — E039 Hypothyroidism, unspecified: Secondary | ICD-10-CM | POA: Diagnosis not present

## 2018-02-21 DIAGNOSIS — M1990 Unspecified osteoarthritis, unspecified site: Secondary | ICD-10-CM | POA: Diagnosis not present

## 2018-03-14 DIAGNOSIS — H40023 Open angle with borderline findings, high risk, bilateral: Secondary | ICD-10-CM | POA: Diagnosis not present

## 2018-03-14 DIAGNOSIS — H5213 Myopia, bilateral: Secondary | ICD-10-CM | POA: Diagnosis not present

## 2018-03-14 DIAGNOSIS — Z961 Presence of intraocular lens: Secondary | ICD-10-CM | POA: Diagnosis not present

## 2018-04-25 ENCOUNTER — Other Ambulatory Visit: Payer: PPO

## 2018-06-27 ENCOUNTER — Other Ambulatory Visit: Payer: PPO

## 2018-08-27 DIAGNOSIS — E039 Hypothyroidism, unspecified: Secondary | ICD-10-CM | POA: Diagnosis not present

## 2018-08-27 DIAGNOSIS — L309 Dermatitis, unspecified: Secondary | ICD-10-CM | POA: Diagnosis not present

## 2018-08-27 DIAGNOSIS — I10 Essential (primary) hypertension: Secondary | ICD-10-CM | POA: Diagnosis not present

## 2018-08-27 DIAGNOSIS — E7800 Pure hypercholesterolemia, unspecified: Secondary | ICD-10-CM | POA: Diagnosis not present

## 2018-08-27 DIAGNOSIS — M1990 Unspecified osteoarthritis, unspecified site: Secondary | ICD-10-CM | POA: Diagnosis not present

## 2018-08-27 DIAGNOSIS — M810 Age-related osteoporosis without current pathological fracture: Secondary | ICD-10-CM | POA: Diagnosis not present

## 2018-08-29 DIAGNOSIS — E7800 Pure hypercholesterolemia, unspecified: Secondary | ICD-10-CM | POA: Diagnosis not present

## 2018-08-29 DIAGNOSIS — I10 Essential (primary) hypertension: Secondary | ICD-10-CM | POA: Diagnosis not present

## 2018-09-13 ENCOUNTER — Other Ambulatory Visit: Payer: PPO

## 2019-01-11 ENCOUNTER — Other Ambulatory Visit: Payer: Self-pay | Admitting: Family Medicine

## 2019-01-11 DIAGNOSIS — M810 Age-related osteoporosis without current pathological fracture: Secondary | ICD-10-CM

## 2019-01-17 ENCOUNTER — Other Ambulatory Visit: Payer: PPO

## 2019-03-01 DIAGNOSIS — M810 Age-related osteoporosis without current pathological fracture: Secondary | ICD-10-CM | POA: Diagnosis not present

## 2019-03-01 DIAGNOSIS — M1990 Unspecified osteoarthritis, unspecified site: Secondary | ICD-10-CM | POA: Diagnosis not present

## 2019-03-01 DIAGNOSIS — L309 Dermatitis, unspecified: Secondary | ICD-10-CM | POA: Diagnosis not present

## 2019-03-01 DIAGNOSIS — E039 Hypothyroidism, unspecified: Secondary | ICD-10-CM | POA: Diagnosis not present

## 2019-03-01 DIAGNOSIS — I10 Essential (primary) hypertension: Secondary | ICD-10-CM | POA: Diagnosis not present

## 2019-03-01 DIAGNOSIS — E7800 Pure hypercholesterolemia, unspecified: Secondary | ICD-10-CM | POA: Diagnosis not present

## 2019-03-20 DIAGNOSIS — H532 Diplopia: Secondary | ICD-10-CM | POA: Diagnosis not present

## 2019-03-20 DIAGNOSIS — Z961 Presence of intraocular lens: Secondary | ICD-10-CM | POA: Diagnosis not present

## 2019-03-20 DIAGNOSIS — H40013 Open angle with borderline findings, low risk, bilateral: Secondary | ICD-10-CM | POA: Diagnosis not present

## 2019-03-20 DIAGNOSIS — H524 Presbyopia: Secondary | ICD-10-CM | POA: Diagnosis not present

## 2019-04-10 ENCOUNTER — Ambulatory Visit: Payer: PPO | Attending: Internal Medicine

## 2019-04-10 DIAGNOSIS — Z23 Encounter for immunization: Secondary | ICD-10-CM

## 2019-04-10 NOTE — Progress Notes (Signed)
   Covid-19 Vaccination Clinic  Name:  Stacey Stevens    MRN: [id number] DOB: [birth year]  04/10/2019  Stacey Stevens was observed post Covid-19 immunization for 15 minutes without incident. She was provided with Vaccine Information Sheet and instruction to access the V-Safe system.   Stacey Stevens was instructed to call 911 with any severe reactions post vaccine: Marland Kitchen Difficulty breathing  . Swelling of face and throat  . A fast heartbeat  . A bad rash all over body  . Dizziness and weakness   Immunizations Administered    Name Date Dose VIS Date Route   Pfizer COVID-19 Vaccine 04/10/2019  9:45 AM 0.3 mL 12/14/2018 Intramuscular   Manufacturer: [employer], [employer]   Lot: [id number]   [place]: [id number]

## 2019-05-27 ENCOUNTER — Other Ambulatory Visit: Payer: Self-pay | Admitting: Family Medicine

## 2019-05-27 DIAGNOSIS — Z1231 Encounter for screening mammogram for malignant neoplasm of breast: Secondary | ICD-10-CM

## 2019-07-10 ENCOUNTER — Ambulatory Visit
Admission: RE | Admit: 2019-07-10 | Discharge: 2019-07-10 | Disposition: A | Discharge status: Home / Self Care | Payer: PPO | Source: Ambulatory Visit | Attending: Family Medicine | Admitting: Family Medicine

## 2019-07-10 ENCOUNTER — Other Ambulatory Visit: Payer: Self-pay

## 2019-07-10 DIAGNOSIS — Z780 Asymptomatic menopausal state: Secondary | ICD-10-CM | POA: Diagnosis not present

## 2019-07-10 DIAGNOSIS — Z1231 Encounter for screening mammogram for malignant neoplasm of breast: Secondary | ICD-10-CM

## 2019-07-10 DIAGNOSIS — M810 Age-related osteoporosis without current pathological fracture: Secondary | ICD-10-CM | POA: Diagnosis not present

## 2019-09-23 DIAGNOSIS — Z23 Encounter for immunization: Secondary | ICD-10-CM | POA: Diagnosis not present

## 2019-09-23 DIAGNOSIS — M810 Age-related osteoporosis without current pathological fracture: Secondary | ICD-10-CM | POA: Diagnosis not present

## 2019-09-23 DIAGNOSIS — I10 Essential (primary) hypertension: Secondary | ICD-10-CM | POA: Diagnosis not present

## 2019-09-23 DIAGNOSIS — E039 Hypothyroidism, unspecified: Secondary | ICD-10-CM | POA: Diagnosis not present

## 2019-09-23 DIAGNOSIS — Z0000 Encounter for general adult medical examination without abnormal findings: Secondary | ICD-10-CM | POA: Diagnosis not present

## 2019-09-23 DIAGNOSIS — M1990 Unspecified osteoarthritis, unspecified site: Secondary | ICD-10-CM | POA: Diagnosis not present

## 2019-09-23 DIAGNOSIS — E7800 Pure hypercholesterolemia, unspecified: Secondary | ICD-10-CM | POA: Diagnosis not present

## 2020-03-02 DIAGNOSIS — B354 Tinea corporis: Secondary | ICD-10-CM | POA: Diagnosis not present

## 2020-03-02 DIAGNOSIS — E039 Hypothyroidism, unspecified: Secondary | ICD-10-CM | POA: Diagnosis not present

## 2020-03-02 DIAGNOSIS — E7800 Pure hypercholesterolemia, unspecified: Secondary | ICD-10-CM | POA: Diagnosis not present

## 2020-03-02 DIAGNOSIS — M1990 Unspecified osteoarthritis, unspecified site: Secondary | ICD-10-CM | POA: Diagnosis not present

## 2020-03-02 DIAGNOSIS — M810 Age-related osteoporosis without current pathological fracture: Secondary | ICD-10-CM | POA: Diagnosis not present

## 2020-03-02 DIAGNOSIS — L309 Dermatitis, unspecified: Secondary | ICD-10-CM | POA: Diagnosis not present

## 2020-03-02 DIAGNOSIS — I10 Essential (primary) hypertension: Secondary | ICD-10-CM | POA: Diagnosis not present

## 2020-03-25 DIAGNOSIS — H52203 Unspecified astigmatism, bilateral: Secondary | ICD-10-CM | POA: Diagnosis not present

## 2020-03-25 DIAGNOSIS — Z961 Presence of intraocular lens: Secondary | ICD-10-CM | POA: Diagnosis not present

## 2020-06-15 DIAGNOSIS — Z23 Encounter for immunization: Secondary | ICD-10-CM | POA: Diagnosis not present

## 2020-08-26 ENCOUNTER — Other Ambulatory Visit: Payer: Self-pay | Admitting: Family Medicine

## 2020-08-26 DIAGNOSIS — Z1231 Encounter for screening mammogram for malignant neoplasm of breast: Secondary | ICD-10-CM

## 2020-08-31 ENCOUNTER — Ambulatory Visit
Admission: RE | Admit: 2020-08-31 | Discharge: 2020-08-31 | Disposition: A | Discharge status: Home / Self Care | Payer: PPO | Source: Ambulatory Visit | Attending: Family Medicine | Admitting: Family Medicine

## 2020-08-31 ENCOUNTER — Other Ambulatory Visit: Payer: Self-pay

## 2020-08-31 DIAGNOSIS — Z1231 Encounter for screening mammogram for malignant neoplasm of breast: Secondary | ICD-10-CM | POA: Diagnosis not present

## 2020-09-23 DIAGNOSIS — E7800 Pure hypercholesterolemia, unspecified: Secondary | ICD-10-CM | POA: Diagnosis not present

## 2020-09-23 DIAGNOSIS — K219 Gastro-esophageal reflux disease without esophagitis: Secondary | ICD-10-CM | POA: Diagnosis not present

## 2020-09-23 DIAGNOSIS — E039 Hypothyroidism, unspecified: Secondary | ICD-10-CM | POA: Diagnosis not present

## 2020-09-23 DIAGNOSIS — Z0000 Encounter for general adult medical examination without abnormal findings: Secondary | ICD-10-CM | POA: Diagnosis not present

## 2020-09-23 DIAGNOSIS — D485 Neoplasm of uncertain behavior of skin: Secondary | ICD-10-CM | POA: Diagnosis not present

## 2020-09-23 DIAGNOSIS — M810 Age-related osteoporosis without current pathological fracture: Secondary | ICD-10-CM | POA: Diagnosis not present

## 2020-09-23 DIAGNOSIS — M1990 Unspecified osteoarthritis, unspecified site: Secondary | ICD-10-CM | POA: Diagnosis not present

## 2020-09-23 DIAGNOSIS — I10 Essential (primary) hypertension: Secondary | ICD-10-CM | POA: Diagnosis not present

## 2020-09-23 DIAGNOSIS — Z23 Encounter for immunization: Secondary | ICD-10-CM | POA: Diagnosis not present

## 2021-03-08 DIAGNOSIS — H9190 Unspecified hearing loss, unspecified ear: Secondary | ICD-10-CM | POA: Diagnosis not present

## 2021-03-08 DIAGNOSIS — H6123 Impacted cerumen, bilateral: Secondary | ICD-10-CM | POA: Diagnosis not present

## 2021-03-24 DIAGNOSIS — I10 Essential (primary) hypertension: Secondary | ICD-10-CM | POA: Diagnosis not present

## 2021-03-24 DIAGNOSIS — M810 Age-related osteoporosis without current pathological fracture: Secondary | ICD-10-CM | POA: Diagnosis not present

## 2021-03-24 DIAGNOSIS — E039 Hypothyroidism, unspecified: Secondary | ICD-10-CM | POA: Diagnosis not present

## 2021-03-24 DIAGNOSIS — E7800 Pure hypercholesterolemia, unspecified: Secondary | ICD-10-CM | POA: Diagnosis not present

## 2021-03-24 DIAGNOSIS — R634 Abnormal weight loss: Secondary | ICD-10-CM | POA: Diagnosis not present

## 2021-03-24 DIAGNOSIS — M1990 Unspecified osteoarthritis, unspecified site: Secondary | ICD-10-CM | POA: Diagnosis not present

## 2021-03-31 DIAGNOSIS — H524 Presbyopia: Secondary | ICD-10-CM | POA: Diagnosis not present

## 2021-03-31 DIAGNOSIS — H40013 Open angle with borderline findings, low risk, bilateral: Secondary | ICD-10-CM | POA: Diagnosis not present

## 2021-03-31 DIAGNOSIS — Z961 Presence of intraocular lens: Secondary | ICD-10-CM | POA: Diagnosis not present

## 2021-05-25 DIAGNOSIS — H6121 Impacted cerumen, right ear: Secondary | ICD-10-CM | POA: Diagnosis not present

## 2021-05-25 DIAGNOSIS — H938X2 Other specified disorders of left ear: Secondary | ICD-10-CM | POA: Diagnosis not present

## 2021-07-29 DIAGNOSIS — H903 Sensorineural hearing loss, bilateral: Secondary | ICD-10-CM | POA: Diagnosis not present

## 2021-09-21 ENCOUNTER — Emergency Department (HOSPITAL_COMMUNITY)
Admission: EM | Admit: 2021-09-21 | Discharge: 2021-09-21 | Disposition: A | Discharge status: Home / Self Care | Payer: PPO | Attending: Emergency Medicine | Admitting: Emergency Medicine

## 2021-09-21 ENCOUNTER — Emergency Department (HOSPITAL_COMMUNITY): Payer: PPO

## 2021-09-21 ENCOUNTER — Other Ambulatory Visit: Payer: Self-pay

## 2021-09-21 DIAGNOSIS — Z7982 Long term (current) use of aspirin: Secondary | ICD-10-CM | POA: Diagnosis not present

## 2021-09-21 DIAGNOSIS — Z79899 Other long term (current) drug therapy: Secondary | ICD-10-CM | POA: Insufficient documentation

## 2021-09-21 DIAGNOSIS — M549 Dorsalgia, unspecified: Secondary | ICD-10-CM | POA: Insufficient documentation

## 2021-09-21 DIAGNOSIS — R079 Chest pain, unspecified: Secondary | ICD-10-CM | POA: Diagnosis not present

## 2021-09-21 DIAGNOSIS — R0789 Other chest pain: Secondary | ICD-10-CM | POA: Diagnosis not present

## 2021-09-21 DIAGNOSIS — M40204 Unspecified kyphosis, thoracic region: Secondary | ICD-10-CM | POA: Diagnosis not present

## 2021-09-21 DIAGNOSIS — I10 Essential (primary) hypertension: Secondary | ICD-10-CM | POA: Insufficient documentation

## 2021-09-21 LAB — CBC WITH DIFFERENTIAL/PLATELET
Abs Immature Granulocytes: 0.29 10*3/uL — ABNORMAL HIGH (ref 0.00–0.07)
Basophils Absolute: 0.1 10*3/uL (ref 0.0–0.1)
Basophils Relative: 1 %
Eosinophils Absolute: 0 10*3/uL (ref 0.0–0.5)
Eosinophils Relative: 0 %
HCT: 36.1 % (ref 36.0–46.0)
Hemoglobin: 11.5 g/dL — ABNORMAL LOW (ref 12.0–15.0)
Immature Granulocytes: 5 %
Lymphocytes Relative: 25 %
Lymphs Abs: 1.4 10*3/uL (ref 0.7–4.0)
MCH: 34.5 pg — ABNORMAL HIGH (ref 26.0–34.0)
MCHC: 31.9 g/dL (ref 30.0–36.0)
MCV: 108.4 fL — ABNORMAL HIGH (ref 80.0–100.0)
Monocytes Absolute: 0.9 10*3/uL (ref 0.1–1.0)
Monocytes Relative: 16 %
Neutro Abs: 3 10*3/uL (ref 1.7–7.7)
Neutrophils Relative %: 53 %
Platelets: 94 10*3/uL — ABNORMAL LOW (ref 150–400)
RBC: 3.33 MIL/uL — ABNORMAL LOW (ref 3.87–5.11)
RDW: 14.2 % (ref 11.5–15.5)
WBC: 5.7 10*3/uL (ref 4.0–10.5)
nRBC: 0 % (ref 0.0–0.2)

## 2021-09-21 LAB — BASIC METABOLIC PANEL
Anion gap: 12 (ref 5–15)
BUN: 19 mg/dL (ref 8–23)
CO2: 24 mmol/L (ref 22–32)
Calcium: 9 mg/dL (ref 8.9–10.3)
Chloride: 102 mmol/L (ref 98–111)
Creatinine, Ser: 0.83 mg/dL (ref 0.44–1.00)
GFR, Estimated: >60 mL/min (ref >60)
Glucose, Bld: 115 mg/dL — ABNORMAL HIGH (ref 70–99)
Potassium: 4 mmol/L (ref 3.5–5.1)
Sodium: 138 mmol/L (ref 135–145)

## 2021-09-21 LAB — TROPONIN I (HIGH SENSITIVITY)
Troponin I (High Sensitivity): 9 ng/L (ref <18)
Troponin I (High Sensitivity): 14 ng/L (ref <18)

## 2021-09-21 NOTE — ED Provider Notes (Signed)
[facility] EMERGENCY DEPARTMENT Provider Note   CSN: [id number] Arrival date & time: 09/21/21  0321     History  Chief Complaint  Patient presents with   Chest Pain   Back Pain    Stacey Stevens is a 88 y.o. female.  Patient presents to the hospital complaining of left-sided chest and back pain.  Patient states that she was sitting at home yesterday afternoon at approximately 4 PM and felt a mild pain under her left breast which radiated to her back.  She states the pain most has been 2 out of 10 in severity.  She denies any radiation of symptoms.  She denies shortness of breath, abdominal pain, nausea, vomiting, headache, lightheadedness, syncope.  She states that she came to the hospital today because she was concerned that she may have a heart issue with the pain being on the left side.  The patient has past medical history significant for hypertension, dyslipidemia, history of CVA  HPI     Home Medications Prior to Admission medications   Medication Sig Start Date End Date Taking? Authorizing Provider  amLODipine (NORVASC) 5 MG tablet Take 1 tablet (5 mg total) by mouth daily. 12/03/11   Viyuoh, Alison Stalling, MD  aspirin EC 81 MG tablet Take 81 mg by mouth daily.    [provider]  calcium carbonate (OS-CAL) 600 MG TABS Take 600 mg by mouth 2 (two) times daily with a meal.    [provider]  levothyroxine (SYNTHROID, LEVOTHROID) 50 MCG tablet Take 50 mcg by mouth daily.    [provider]  lovastatin (MEVACOR) 20 MG tablet Take 20 mg by mouth at bedtime.    [provider]  magnesium oxide (MAG-OX) 400 MG tablet Take 400 mg by mouth daily.    [provider]  Multiple Vitamin (MULTIVITAMIN WITH MINERALS) TABS Take 1 tablet by mouth daily.    [provider]      Allergies    Amoxicillin, Keflex [cephalexin], and Macrodantin [nitrofurantoin macrocrystal]    Review of Systems   Review of Systems   Respiratory:  Negative for shortness of breath.   Cardiovascular:  Positive for chest pain. Negative for palpitations and leg swelling.  Gastrointestinal:  Negative for abdominal pain, nausea and vomiting.  Genitourinary:  Negative for dysuria.  Neurological:  Negative for syncope, light-headedness and headaches.    Physical Exam Updated Vital Signs BP (!) 157/68   Pulse 90   Temp 98.4 F (36.9 C) (Oral)   Resp 16   Ht '4\' 9"'$  (1.448 m)   Wt 55.3 kg   SpO2 100%   BMI 26.40 kg/m  Physical Exam Vitals and nursing note reviewed.  Constitutional:      General: She is not in acute distress. HENT:     Head: Normocephalic and atraumatic.  Eyes:     Extraocular Movements: Extraocular movements intact.  Cardiovascular:     Rate and Rhythm: Normal rate and regular rhythm.     Heart sounds: Normal heart sounds.  Pulmonary:     Effort: Pulmonary effort is normal.     Breath sounds: Normal breath sounds.  Chest:     Chest wall: No tenderness.  Abdominal:     Palpations: Abdomen is soft.     Tenderness: There is no abdominal tenderness.  Musculoskeletal:        General: Normal range of motion.     Cervical back: Normal range of motion and neck supple.  Right lower leg: No edema.     Left lower leg: No edema.  Skin:    General: Skin is warm and dry.     Capillary Refill: Capillary refill takes less than 2 seconds.  Neurological:     Mental Status: She is alert and oriented to person, place, and time.     ED Results / Procedures / Treatments   Labs (all labs ordered are listed, but only abnormal results are displayed) Labs Reviewed  BASIC METABOLIC PANEL - Abnormal; Notable for the following components:      Result Value   Glucose, Bld 115 (*)    All other components within normal limits  CBC WITH DIFFERENTIAL/PLATELET - Abnormal; Notable for the following components:   RBC 3.33 (*)    Hemoglobin 11.5 (*)    MCV 108.4 (*)    MCH 34.5 (*)    Platelets 94 (*)    Abs  Immature Granulocytes 0.29 (*)    All other components within normal limits  TROPONIN I (HIGH SENSITIVITY)  TROPONIN I (HIGH SENSITIVITY)    EKG None  Radiology DG Thoracic Spine 2 View  Result Date: 09/21/2021 CLINICAL DATA:  Possible T10 fracture seen on x-ray. EXAM: THORACIC SPINE 2 VIEWS COMPARISON:  Chest x-ray from same day. FINDINGS: There is no evidence of thoracic spine fracture. Mild L1 superior endplate compression deformity. Remaining vertebral body heights are preserved. Thoracolumbar scoliosis. Exaggerated thoracic kyphosis. Osteopenia. IMPRESSION: 1. Mild L1 superior endplate compression deformity, age-indeterminate. Correlate with point tenderness. 2. Thoracolumbar scoliosis with exaggerated thoracic kyphosis. Electronically Signed   By: Titus Dubin M.D.   On: 09/21/2021 13:46   DG Chest 2 View  Result Date: 09/21/2021 CLINICAL DATA:  Provided history: Chest pain. Additional history provided: Chest/back pain. EXAM: CHEST - 2 VIEW COMPARISON:  Prior chest radiograph 12/01/2011. FINDINGS: Heart size within normal limits. Tortuous thoracic aorta. Aortic atherosclerosis. No appreciable airspace consolidation or pulmonary edema. No evidence of pleural effusion or pneumothorax. Levocurvature of the mid to lower thoracic spine. Thoracic spondylosis. Exaggerated thoracic kyphosis. A lower thoracic vertebral compression fracture is questioned (tentatively the T10 vertebra). However, diffuse osseous demineralization limits evaluation this finding is not definite. IMPRESSION: No evidence of acute cardiopulmonary abnormality. A lower thoracic vertebral compression fracture is questioned (tentatively the T10 vertebra). However, diffuse osseous demineralization limits evaluation and this finding is not definite. Dedicated thoracic spine radiographs are recommended for further evaluation. Exaggerated thoracic kyphosis. Levocurvature of the mid-to-lower thoracic spine. Thoracic spondylosis.  Aortic Atherosclerosis (ICD10-I70.0). Electronically Signed   By: Kellie Simmering D.O.   On: 09/21/2021 11:34    Procedures Procedures    Medications Ordered in ED Medications - No data to display  ED Course/ Medical Decision Making/ A&P                           Medical Decision Making Amount and/or Complexity of Data Reviewed Radiology: ordered.   This patient presents to the ED for concern of chest pain, this involves an extensive number of treatment options, and is a complaint that carries with it a high risk of complications and morbidity.  The differential diagnosis includes musculoskeletal pain, ACS, PE, dissection, pneumonia, others   Co morbidities that complicate the patient evaluation  History of hypertension   Additional history obtained:  Additional history obtained from family at bedside   Lab Tests:  I Ordered, and personally interpreted labs.  The pertinent results include:  Hemoglobin 11.5, glucose  115, initial troponin 9, repeat troponin 14   Imaging Studies ordered:  I ordered imaging studies including chest x-ray and thoracic spine films I independently visualized and interpreted imaging which showed  1. Mild L1 superior endplate compression deformity,  age-indeterminate. Correlate with point tenderness.  2. Thoracolumbar scoliosis with exaggerated thoracic kyphosis   No evidence of acute cardiopulmonary abnormality.    A lower thoracic vertebral compression fracture is questioned  (tentatively the T10 vertebra). However, diffuse osseous  demineralization limits evaluation and this finding is not definite.  Dedicated thoracic spine radiographs are recommended for further  evaluation.    Exaggerated thoracic kyphosis. Levocurvature of the mid-to-lower  thoracic spine. Thoracic spondylosis.   I agree with the radiologist interpretation   Cardiac Monitoring: / EKG:  The patient was maintained on a cardiac monitor.  I personally viewed and  interpreted the cardiac monitored which showed an underlying rhythm of: Normal sinus rhythm   Test / Admission - Considered:  The patient has no ischemic changes on EKG and has repeat troponins of 9014 showing no signs of ACS at this time.  The patient is not short of breath, has no sharp pain, has mild pain, and is not tachycardic.  Very low clinical suspicion of PE.  Patient with no ripping/tearing pain, very minimal pain, very low clinical suspicion of dissection.  No pneumonia on chest x-ray.  No shortness of breath/signs of infection.  Unclear etiology at this time the patient's pain but do not feel that there are any life-threatening conditions at this time.  Patient given return precautions including worsening chest pain and shortness of breath.  Patient and family bedside given instructions and voiced understanding.  Discharge home        Final Clinical Impression(s) / ED Diagnoses Final diagnoses:  Chest pain, unspecified type    Rx / DC Orders ED Discharge Orders     None         Ronny Bacon 09/21/21 1520    Fransico Meadow, MD 09/23/21 1057

## 2021-09-21 NOTE — Discharge Instructions (Addendum)
You were seen today for chest pain.  Your work-up was reassuring for no signs of acute coronary syndrome.  If your chest pain worsens, you develop shortness of breath, or other life-threatening conditions, I recommend return to the emergency department for reevaluation.  Please follow-up otherwise with your primary care provider

## 2021-09-21 NOTE — ED Triage Notes (Signed)
Pt. Stated, yesterday I had some chest pain that went into my back area,

## 2021-09-21 NOTE — ED Provider Triage Note (Signed)
Emergency Medicine Provider Triage Evaluation Note  Stacey Stevens , a 88 y.o. female  was evaluated in triage.  Pt complains of chest pain.  Patient states that pain began yesterday afternoon.  States that it is mostly sharp and under her left breast.  States that it radiates to her mid back.  She denies having associated shortness of breath, palpitations, lightheaded or dizziness, nausea or diaphoresis.  States that it has been mostly constant since then.  No aggravating factors.  States that if she sits up straight and tall at times the pain can go away..  Review of Systems  Positive: See above Negative:   Physical Exam  BP (!) 169/75 (BP Location: Left Arm)   Pulse 90   Temp 98.4 F (36.9 C) (Oral)   Resp 16   Ht '4\' 9"'$  (1.448 m)   Wt 55.3 kg   SpO2 98%   BMI 26.40 kg/m  Gen:   Awake, no distress   Resp:  Normal effort  MSK:   Moves extremities without difficulty  Other:  S1/S2, radial pulse 1+ bilaterally  Medical Decision Making  Medically screening exam initiated at 10:55 AM.  Appropriate orders placed.  Stacey Stevens was informed that the remainder of the evaluation will be completed by another provider, this initial triage assessment does not replace that evaluation, and the importance of remaining in the ED until their evaluation is complete.     Mickie Hillier, PA-C 09/21/21 1056

## 2021-09-27 DIAGNOSIS — K219 Gastro-esophageal reflux disease without esophagitis: Secondary | ICD-10-CM | POA: Diagnosis not present

## 2021-09-27 DIAGNOSIS — E7800 Pure hypercholesterolemia, unspecified: Secondary | ICD-10-CM | POA: Diagnosis not present

## 2021-09-27 DIAGNOSIS — M810 Age-related osteoporosis without current pathological fracture: Secondary | ICD-10-CM | POA: Diagnosis not present

## 2021-09-27 DIAGNOSIS — Z0000 Encounter for general adult medical examination without abnormal findings: Secondary | ICD-10-CM | POA: Diagnosis not present

## 2021-09-27 DIAGNOSIS — I10 Essential (primary) hypertension: Secondary | ICD-10-CM | POA: Diagnosis not present

## 2021-09-27 DIAGNOSIS — Z23 Encounter for immunization: Secondary | ICD-10-CM | POA: Diagnosis not present

## 2021-09-27 DIAGNOSIS — E039 Hypothyroidism, unspecified: Secondary | ICD-10-CM | POA: Diagnosis not present

## 2021-11-15 DIAGNOSIS — H6123 Impacted cerumen, bilateral: Secondary | ICD-10-CM | POA: Diagnosis not present

## 2021-11-29 DIAGNOSIS — H903 Sensorineural hearing loss, bilateral: Secondary | ICD-10-CM | POA: Diagnosis not present

## 2021-12-03 DIAGNOSIS — R634 Abnormal weight loss: Secondary | ICD-10-CM | POA: Diagnosis not present

## 2021-12-03 DIAGNOSIS — E039 Hypothyroidism, unspecified: Secondary | ICD-10-CM | POA: Diagnosis not present

## 2021-12-03 DIAGNOSIS — E049 Nontoxic goiter, unspecified: Secondary | ICD-10-CM | POA: Diagnosis not present

## 2022-01-05 DIAGNOSIS — R233 Spontaneous ecchymoses: Secondary | ICD-10-CM | POA: Diagnosis not present

## 2022-01-05 DIAGNOSIS — B354 Tinea corporis: Secondary | ICD-10-CM | POA: Diagnosis not present

## 2022-01-17 ENCOUNTER — Telehealth: Payer: Self-pay | Admitting: Physician Assistant

## 2022-01-17 NOTE — Telephone Encounter (Signed)
Patient's son called to r/s appointment due to transportation issues. Patient was r/s and will be notified.

## 2022-01-18 ENCOUNTER — Other Ambulatory Visit: Payer: PPO

## 2022-01-18 ENCOUNTER — Encounter: Payer: PPO | Admitting: Physician Assistant

## 2022-01-19 DIAGNOSIS — R21 Rash and other nonspecific skin eruption: Secondary | ICD-10-CM | POA: Diagnosis not present

## 2022-01-19 DIAGNOSIS — L299 Pruritus, unspecified: Secondary | ICD-10-CM | POA: Diagnosis not present

## 2022-01-19 DIAGNOSIS — D696 Thrombocytopenia, unspecified: Secondary | ICD-10-CM | POA: Diagnosis not present

## 2022-01-31 NOTE — Progress Notes (Unsigned)
[place] [phone]   [phone] [place] NOTE  Patient Care Team: Alroy Dust, L.Marlou Sa, MD as PCP - General (Family Medicine)  [place] from PCP, Dr. Collene Leyden  03/24/2021: WBC 5.5, Hgb 12.9, MCV 101.0 (H), Plt 144 (L) 01/05/2022: WBC 4.1, Hgb 11.4 (L), MCV 104.7 (H), Plt 100 (L) 01/22/2022: Establish care with [facility]   CHIEF COMPLAINTS/PURPOSE OF CONSULTATION:  Macrocytic anemia Thrombocytopenia  HISTORY OF PRESENTING ILLNESS:  Stacey Stevens 89 y.o. female with medical history significant for GERD, hypertension, hypothyroidism, hypercholesteremia and osteoporosis presents to the hematology clinic for evaluation of macrocytic anemia and thrombocytopenia. She is accompanied by her son for this visit.  On exam today, Stacey Stevens reports that her energy levels are fairly stable. She does have some baseline fatigue but that has not changed recently. Her appetite and weight are overall stable. She denies nausea, vomiting or abdominal pain. She has chronic constipation that is well managed with daily stool softeners. She denies easy bruising or signs of active bleeding. She has noticed a mild red rash involving her lower extremities that has improved. She denies fevers, chills, sweats, shortness of breath, chest pain or cough. She has no other complaints. Rest of the 10 point ROS is below.   MEDICAL HISTORY:  Past Medical History:  Diagnosis Date   GERD (gastroesophageal reflux disease)    High blood cholesterol    Hypercholesteremia    Hypertension    Hypothyroidism    Osteoporosis     SURGICAL HISTORY: Past Surgical History:  Procedure Laterality Date   CATARACT EXTRACTION, BILATERAL      SOCIAL HISTORY: Social History   Socioeconomic History   Marital status: Widowed    Spouse name: Not on file   Number of children: Not on file   Years of education: Not on file   Highest education level: Not  on file  Occupational History   Not on file  Tobacco Use   Smoking status: Never   Smokeless tobacco: Not on file  Substance and Sexual Activity   Alcohol use: No   Drug use: No   Sexual activity: Not on file  Other Topics Concern   Not on file  Social History Narrative   Not on file   Social Determinants of Health   Financial Resource Strain: Not on file  Food Insecurity: No Food Insecurity (02/01/2022)   Hunger Vital Sign    Worried About Running Out of Food in the Last Year: Never true    Ran Out of Food in the Last Year: Never true  Transportation Needs: No Transportation Needs (02/01/2022)   PRAPARE - Hydrologist (Medical): No    Lack of Transportation (Non-Medical): No  Physical Activity: Not on file  Stress: Not on file  Social Connections: Not on file  Intimate Partner Violence: Not At Risk (02/01/2022)   Humiliation, Afraid, Rape, and Kick questionnaire    Fear of Current or Ex-Partner: No    Emotionally Abused: No    Physically Abused: No    Sexually Abused: No    FAMILY HISTORY: Family History  Problem Relation Age of Onset   Breast cancer Paternal Aunt     ALLERGIES:  is allergic to amoxicillin, hydrochlorothiazide, keflex [cephalexin], and macrodantin [nitrofurantoin macrocrystal].  MEDICATIONS:  Current Outpatient Medications  Medication Sig Dispense Refill   acetaminophen (TYLENOL) 500 MG tablet Take 1,000 mg by mouth every 6 (six) hours as needed for moderate  pain. Takes 2 tablets three times a day     amLODipine (NORVASC) 5 MG tablet Take 1 tablet (5 mg total) by mouth daily.     calcium carbonate (OS-CAL) 600 MG TABS Take 600 mg by mouth 2 (two) times daily with a meal.     levothyroxine (SYNTHROID, LEVOTHROID) 50 MCG tablet Take 50 mcg by mouth daily.     losartan (COZAAR) 100 MG tablet Take 100 mg by mouth daily.     lovastatin (MEVACOR) 20 MG tablet Take 20 mg by mouth at bedtime.     magnesium oxide (MAG-OX) 400 MG  tablet Take 400 mg by mouth daily.     Multiple Vitamin (MULTIVITAMIN WITH MINERALS) TABS Take 1 tablet by mouth daily.     raloxifene (EVISTA) 60 MG tablet Take 60 mg by mouth daily.     aspirin EC 81 MG tablet Take 81 mg by mouth daily. (Patient not taking: Reported on 02/01/2022)     No current facility-administered medications for this visit.    REVIEW OF SYSTEMS:   Constitutional: ( - ) fevers, ( - )  chills , ( - ) night sweats Eyes: ( - ) blurriness of vision, ( - ) double vision, ( - ) watery eyes Ears, nose, mouth, throat, and face: ( - ) mucositis, ( - ) sore throat Respiratory: ( - ) cough, ( - ) dyspnea, ( - ) wheezes Cardiovascular: ( - ) palpitation, ( - ) chest discomfort, ( - ) lower extremity swelling Gastrointestinal:  ( - ) nausea, ( - ) heartburn, ( - ) change in bowel habits Skin: ( - ) abnormal skin rashes Lymphatics: ( - ) new lymphadenopathy, ( - ) easy bruising Neurological: ( - ) numbness, ( - ) tingling, ( - ) new weaknesses Behavioral/Psych: ( - ) mood change, ( - ) new changes  All other systems were reviewed with the patient and are negative.  PHYSICAL EXAMINATION: ECOG PERFORMANCE STATUS: 1 - Symptomatic but completely ambulatory  Vitals:   02/01/22 1347  BP: (!) 173/75  Pulse: 81  Resp: 16  Temp: 97.9 F (36.6 C)  SpO2: 100%   Filed Weights   02/01/22 1347  Weight: 122 lb 3.2 oz (55.4 kg)    GENERAL: elderly appearing female in NAD  SKIN: skin color, texture, turgor are normal. Petechiae noted in lower extremities bilaterally.  EYES: conjunctiva are pink and non-injected, sclera clear LUNGS: clear to auscultation and percussion with normal breathing effort HEART: regular rate & rhythm and no murmurs and no lower extremity edema Musculoskeletal: no cyanosis of digits and no clubbing  PSYCH: alert & oriented x 3, fluent speech NEURO: no focal motor/sensory deficits  LABORATORY DATA:  I have reviewed the data as listed    Latest Ref Rng &  Units 02/01/2022    2:45 PM 09/21/2021   11:06 AM 12/03/2011    5:00 AM  CBC  WBC 4.0 - 10.5 K/uL 5.6  5.7  12.9   Hemoglobin 12.0 - 15.0 g/dL 10.8  11.5  10.4   Hematocrit 36.0 - 46.0 % 32.9  36.1  30.7   Platelets 150 - 400 K/uL 113  94  181        Latest Ref Rng & Units 09/21/2021   11:06 AM 12/03/2011    5:00 AM 12/02/2011    5:45 AM  CMP  Glucose 70 - 99 mg/dL 115  99  112   BUN 8 - 23 mg/dL 19  14  18   Creatinine 0.44 - 1.00 mg/dL 0.83  0.63  0.62   Sodium 135 - 145 mmol/L 138  138  129   Potassium 3.5 - 5.1 mmol/L 4.0  4.8  2.8   Chloride 98 - 111 mmol/L 102  105  95   CO2 22 - 32 mmol/L '24  26  26   '$ Calcium 8.9 - 10.3 mg/dL 9.0  8.6  8.3    ASSESSMENT & PLAN SION THANE is a 89 y.o. female who presents to the hematology clinic for evaluation of macrocytic anemia and thrombocytopenia. I reviewed potential etiologies including liver disease, splenomegaly, infectious processes, nutritional anemias, immune mediated and bone marrow disorders. Patient is not taking any medications that can cause anemia or thrombocytopenia. Patient will proceed with labs today to check CBC, CMP, Vitamin B12, MMA, folate, Hepatitis B  and C serologies  #Thrombocytopenia #Macrocytic anemia --Labs today to check CBC w/diff, CMP, B12 level, folate level, Hep B and C serologies --Evaluate for paraproteinemia with SPEP/IFE and serum free light chains.  --Evaluate for platelet abnormality with peripheral smear. --Consider abdominal US if above workup is negative.  --Need to determine if bone marrow biopsy is needed based on today's lab results.  --RTC in 3 months with repeat labs unless further intervention is required.     Orders Placed This Encounter  Procedures   CBC with Differential ([place] Only)    Standing Status:   Future    Number of Occurrences:   1    Standing Expiration Date:   02/01/2023   CMP ([place] only)    Standing Status:   Future    Number of Occurrences:   1     Standing Expiration Date:   02/01/2023   Vitamin B12    Standing Status:   Future    Number of Occurrences:   1    Standing Expiration Date:   01/31/2023   Methylmalonic acid, serum    Standing Status:   Future    Number of Occurrences:   1    Standing Expiration Date:   01/31/2023   Folate, Serum    Standing Status:   Future    Number of Occurrences:   1    Standing Expiration Date:   01/31/2023   Hepatitis B core antibody, total    Standing Status:   Future    Number of Occurrences:   1    Standing Expiration Date:   01/31/2023   Hepatitis B surface antibody    Standing Status:   Future    Number of Occurrences:   1    Standing Expiration Date:   01/31/2023   Hepatitis B surface antigen    Standing Status:   Future    Number of Occurrences:   1    Standing Expiration Date:   01/31/2023   Hepatitis C antibody    Standing Status:   Future    Number of Occurrences:   1    Standing Expiration Date:   01/31/2023   Multiple Myeloma Panel (SPEP&IFE w/QIG)    Standing Status:   Future    Number of Occurrences:   1    Standing Expiration Date:   01/31/2023   Kappa/lambda light chains    Standing Status:   Future    Number of Occurrences:   1    Standing Expiration Date:   01/31/2023   Technologist smear review    Order Specific Question:   Clinical information:  Answer:   thrombocytopenia, macrocytic anemia    All questions were answered. The patient knows to call the clinic with any problems, questions or concerns.  I have spent a total of 60 minutes minutes of face-to-face and non-face-to-face time, preparing to see the patient, obtaining and/or reviewing separately obtained history, performing a medically appropriate examination, counseling and educating the patient, ordering tests/procedures, documenting clinical information in the electronic health record and care coordination.   Dede Query, PA-C Department of Hematology/Oncology [place] at [facility] Phone: [phone]

## 2022-02-01 ENCOUNTER — Inpatient Hospital Stay: Payer: PPO | Attending: Physician Assistant | Admitting: Physician Assistant

## 2022-02-01 ENCOUNTER — Encounter: Payer: Self-pay | Admitting: Physician Assistant

## 2022-02-01 ENCOUNTER — Inpatient Hospital Stay: Payer: PPO

## 2022-02-01 VITALS — BP 173/75 | HR 81 | Temp 97.9°F | Resp 16 | Wt 122.2 lb

## 2022-02-01 DIAGNOSIS — D539 Nutritional anemia, unspecified: Secondary | ICD-10-CM | POA: Diagnosis not present

## 2022-02-01 DIAGNOSIS — E039 Hypothyroidism, unspecified: Secondary | ICD-10-CM | POA: Diagnosis not present

## 2022-02-01 DIAGNOSIS — D696 Thrombocytopenia, unspecified: Secondary | ICD-10-CM | POA: Insufficient documentation

## 2022-02-01 DIAGNOSIS — M810 Age-related osteoporosis without current pathological fracture: Secondary | ICD-10-CM | POA: Insufficient documentation

## 2022-02-01 DIAGNOSIS — I10 Essential (primary) hypertension: Secondary | ICD-10-CM | POA: Diagnosis not present

## 2022-02-01 LAB — CMP (CANCER CENTER ONLY)
ALT: 14 U/L (ref 0–44)
AST: 19 U/L (ref 15–41)
Albumin: 4.2 g/dL (ref 3.5–5.0)
Alkaline Phosphatase: 42 U/L (ref 38–126)
Anion gap: 8 (ref 5–15)
BUN: 34 mg/dL — ABNORMAL HIGH (ref 8–23)
CO2: 26 mmol/L (ref 22–32)
Calcium: 8.9 mg/dL (ref 8.9–10.3)
Chloride: 102 mmol/L (ref 98–111)
Creatinine: 0.94 mg/dL (ref 0.44–1.00)
GFR, Estimated: 57 mL/min — ABNORMAL LOW (ref >60)
Glucose, Bld: 85 mg/dL (ref 70–99)
Potassium: 4.5 mmol/L (ref 3.5–5.1)
Sodium: 136 mmol/L (ref 135–145)
Total Bilirubin: 0.5 mg/dL (ref 0.3–1.2)
Total Protein: 6.9 g/dL (ref 6.5–8.1)

## 2022-02-01 LAB — TECHNOLOGIST SMEAR REVIEW

## 2022-02-01 LAB — CBC WITH DIFFERENTIAL (CANCER CENTER ONLY)
Abs Immature Granulocytes: 0.1 10*3/uL — ABNORMAL HIGH (ref 0.00–0.07)
Basophils Absolute: 0.1 10*3/uL (ref 0.0–0.1)
Basophils Relative: 1 %
Eosinophils Absolute: 0.2 10*3/uL (ref 0.0–0.5)
Eosinophils Relative: 4 %
HCT: 32.9 % — ABNORMAL LOW (ref 36.0–46.0)
Hemoglobin: 10.8 g/dL — ABNORMAL LOW (ref 12.0–15.0)
Immature Granulocytes: 2 %
Lymphocytes Relative: 22 %
Lymphs Abs: 1.3 10*3/uL (ref 0.7–4.0)
MCH: 34.5 pg — ABNORMAL HIGH (ref 26.0–34.0)
MCHC: 32.8 g/dL (ref 30.0–36.0)
MCV: 105.1 fL — ABNORMAL HIGH (ref 80.0–100.0)
Monocytes Absolute: 1.3 10*3/uL — ABNORMAL HIGH (ref 0.1–1.0)
Monocytes Relative: 22 %
Neutro Abs: 2.7 10*3/uL (ref 1.7–7.7)
Neutrophils Relative %: 49 %
Platelet Count: 113 10*3/uL — ABNORMAL LOW (ref 150–400)
RBC: 3.13 MIL/uL — ABNORMAL LOW (ref 3.87–5.11)
RDW: 14.4 % (ref 11.5–15.5)
WBC Count: 5.6 10*3/uL (ref 4.0–10.5)
nRBC: 0 % (ref 0.0–0.2)

## 2022-02-01 LAB — HEPATITIS C ANTIBODY: HCV Ab: NONREACTIVE

## 2022-02-01 LAB — FOLATE: Folate: 27.4 ng/mL (ref >5.9)

## 2022-02-01 LAB — HEPATITIS B SURFACE ANTIGEN: Hepatitis B Surface Ag: NONREACTIVE

## 2022-02-01 LAB — HEPATITIS B CORE ANTIBODY, TOTAL: Hep B Core Total Ab: NONREACTIVE

## 2022-02-01 LAB — VITAMIN B12: Vitamin B-12: 168 pg/mL — ABNORMAL LOW (ref 180–914)

## 2022-02-01 LAB — HEPATITIS B SURFACE ANTIBODY,QUALITATIVE: Hep B S Ab: NONREACTIVE

## 2022-02-02 ENCOUNTER — Telehealth: Payer: Self-pay | Admitting: Hematology and Oncology

## 2022-02-02 LAB — KAPPA/LAMBDA LIGHT CHAINS
Kappa free light chain: 19.8 mg/L — ABNORMAL HIGH (ref 3.3–19.4)
Kappa, lambda light chain ratio: 1.53 (ref 0.26–1.65)
Lambda free light chains: 12.9 mg/L (ref 5.7–26.3)

## 2022-02-02 NOTE — Telephone Encounter (Signed)
Patient's son returned phone call to confirm f/u appointments. Patient will be notified. Mailing reminders.

## 2022-02-02 NOTE — Telephone Encounter (Signed)
Spoke with patient per 1/30 los notes to schedule f/u. Patient requested calling her son POA to confirm dates/times. Left voicemail for patient's son with appointment information and contact details to confirm.

## 2022-02-04 LAB — METHYLMALONIC ACID, SERUM: Methylmalonic Acid, Quantitative: 992 nmol/L — ABNORMAL HIGH (ref 0–378)

## 2022-02-07 LAB — MULTIPLE MYELOMA PANEL, SERUM
Albumin SerPl Elph-Mcnc: 4.1 g/dL (ref 2.9–4.4)
Albumin/Glob SerPl: 1.7 (ref 0.7–1.7)
Alpha 1: 0.3 g/dL (ref 0.0–0.4)
Alpha2 Glob SerPl Elph-Mcnc: 0.7 g/dL (ref 0.4–1.0)
B-Globulin SerPl Elph-Mcnc: 0.9 g/dL (ref 0.7–1.3)
Gamma Glob SerPl Elph-Mcnc: 0.7 g/dL (ref 0.4–1.8)
Globulin, Total: 2.5 g/dL (ref 2.2–3.9)
IgA: 74 mg/dL (ref 64–422)
IgG (Immunoglobin G), Serum: 679 mg/dL (ref 586–1602)
IgM (Immunoglobulin M), Srm: 78 mg/dL (ref 26–217)
Total Protein ELP: 6.6 g/dL (ref 6.0–8.5)

## 2022-02-08 DIAGNOSIS — N172 Acute kidney failure with medullary necrosis: Secondary | ICD-10-CM | POA: Diagnosis not present

## 2022-02-10 ENCOUNTER — Telehealth: Payer: Self-pay | Admitting: Physician Assistant

## 2022-02-10 ENCOUNTER — Telehealth: Payer: Self-pay

## 2022-02-10 NOTE — Telephone Encounter (Signed)
Can you fax patient's labs to Dr. Collene Leyden, PCP  [phone]  is the phone number  and notify team that she has B12 deficiency.   We recommend weekly B12 injections x 4 doses and then transition to once a month injections  43 mins IT you can fax the office note and my telephone note   Records faxed to [phone] and confirmation received

## 2022-02-10 NOTE — Telephone Encounter (Signed)
I called Ms. Stacey Stevens to review the lab results from 02/01/2022. Findings again show macrocytic anemia and thrombocytopenia. Hgb 10.8, MCV 105.1, Plt 113. Additionally, there is evidence of B12 deficiency. Recommend B12 injection weekly x 4 doses and then transition to once a month injections. Patient requested to receive B12 injections through her PCP, Stacey Stevens. We will follow up with Dr. Kirby Stevens team to review lab results and our recommendations. Patient is scheduled to see Dr. Lorenso Stevens back in 3 months with labs. Stacey Stevens expressed understanding and satisfaction with the plan provided.

## 2022-02-11 ENCOUNTER — Telehealth: Payer: Self-pay | Admitting: Hematology and Oncology

## 2022-02-11 NOTE — Telephone Encounter (Signed)
Per Dorsey's schedule rescheduled pt for 05/02/22 to 04/25/22. Pt is aware and confirmed

## 2022-02-14 DIAGNOSIS — E538 Deficiency of other specified B group vitamins: Secondary | ICD-10-CM | POA: Diagnosis not present

## 2022-02-21 DIAGNOSIS — E538 Deficiency of other specified B group vitamins: Secondary | ICD-10-CM | POA: Diagnosis not present

## 2022-02-28 DIAGNOSIS — E538 Deficiency of other specified B group vitamins: Secondary | ICD-10-CM | POA: Diagnosis not present

## 2022-03-07 DIAGNOSIS — E538 Deficiency of other specified B group vitamins: Secondary | ICD-10-CM | POA: Diagnosis not present

## 2022-03-28 DIAGNOSIS — M1990 Unspecified osteoarthritis, unspecified site: Secondary | ICD-10-CM | POA: Diagnosis not present

## 2022-03-28 DIAGNOSIS — D696 Thrombocytopenia, unspecified: Secondary | ICD-10-CM | POA: Diagnosis not present

## 2022-03-28 DIAGNOSIS — I10 Essential (primary) hypertension: Secondary | ICD-10-CM | POA: Diagnosis not present

## 2022-03-28 DIAGNOSIS — K219 Gastro-esophageal reflux disease without esophagitis: Secondary | ICD-10-CM | POA: Diagnosis not present

## 2022-03-28 DIAGNOSIS — E7800 Pure hypercholesterolemia, unspecified: Secondary | ICD-10-CM | POA: Diagnosis not present

## 2022-03-28 DIAGNOSIS — L299 Pruritus, unspecified: Secondary | ICD-10-CM | POA: Diagnosis not present

## 2022-03-28 DIAGNOSIS — E039 Hypothyroidism, unspecified: Secondary | ICD-10-CM | POA: Diagnosis not present

## 2022-03-28 DIAGNOSIS — M810 Age-related osteoporosis without current pathological fracture: Secondary | ICD-10-CM | POA: Diagnosis not present

## 2022-04-04 DIAGNOSIS — E538 Deficiency of other specified B group vitamins: Secondary | ICD-10-CM | POA: Diagnosis not present

## 2022-04-13 DIAGNOSIS — Z961 Presence of intraocular lens: Secondary | ICD-10-CM | POA: Diagnosis not present

## 2022-04-13 DIAGNOSIS — H40023 Open angle with borderline findings, high risk, bilateral: Secondary | ICD-10-CM | POA: Diagnosis not present

## 2022-04-13 DIAGNOSIS — H52203 Unspecified astigmatism, bilateral: Secondary | ICD-10-CM | POA: Diagnosis not present

## 2022-04-24 ENCOUNTER — Other Ambulatory Visit: Payer: Self-pay | Admitting: Hematology and Oncology

## 2022-04-24 DIAGNOSIS — D539 Nutritional anemia, unspecified: Secondary | ICD-10-CM

## 2022-04-24 NOTE — Progress Notes (Unsigned)
[facility] Telephone:(336) [phone]   Fax:(336) [phone]  PROGRESS NOTE  Patient Care Team: Irven Coe, MD as PCP - General (Family Medicine)  Hematological/Oncological History # Macrocytic Anemia # Thrombocytopenia  Labs from PCP, Dr. Irven Coe  03/24/2021: WBC 5.5, Hgb 12.9, MCV 101.0 (H), Plt 144 (L) 01/05/2022: WBC 4.1, Hgb 11.4 (L), MCV 104.7 (H), Plt 100 (L) 01/22/2022: Establish care with [facility]ematology   Interval History:  Stacey Stevens 89 y.o. female with medical history significant for macrocytic anemia/thrombocytopenia who presents for a follow up visit. The patient's last visit was on 02/01/2022. In the interim since the last visit she has continued on vitamin B12 shots with Dr. Lewie Chamber.  On exam today Stacey Stevens reports that she feels a little stronger and has had a boost in energy since receiving the vitamin B12 shots.  She continues to monthly at Dr. Bufford Lope office.  She reports that she also recently started an allergy medication.  She is unsure the name but thinks it is Careers adviser.  She notes that she has runny nose and stuffy sinuses.  She reports that she does take Benadryl on occasion as well.  She notes she has difficulty sleeping and tries to spend about 8 hours a day in bed, but about for those that she is not sleeping.  She reports she was also recently told that her vitamin D levels were too high.  She reports that she is trying to eat more/better.  She reports he does not cook much anymore.  Her next vitamin B12 shot is scheduled for next Monday.  Otherwise denies any fevers, chills, sweats, nausea, vomiting or diarrhea.  A full 10 point ROS is otherwise negative.  MEDICAL HISTORY:  Past Medical History:  Diagnosis Date   GERD (gastroesophageal reflux disease)    High blood cholesterol    Hypercholesteremia    Hypertension    Hypothyroidism    Osteoporosis     SURGICAL HISTORY: Past Surgical History:  Procedure Laterality Date   CATARACT  EXTRACTION, BILATERAL      SOCIAL HISTORY: Social History   Socioeconomic History   Marital status: Widowed    Spouse name: Not on file   Number of children: Not on file   Years of education: Not on file   Highest education level: Not on file  Occupational History   Not on file  Tobacco Use   Smoking status: Never   Smokeless tobacco: Never  Substance and Sexual Activity   Alcohol use: No   Drug use: No   Sexual activity: Not on file  Other Topics Concern   Not on file  Social History Narrative   Not on file   Social Determinants of Health   Financial Resource Strain: Not on file  Food Insecurity: No Food Insecurity (02/01/2022)   Hunger Vital Sign    Worried About Running Out of Food in the Last Year: Never true    Ran Out of Food in the Last Year: Never true  Transportation Needs: No Transportation Needs (02/01/2022)   PRAPARE - Administrator, Civil Service (Medical): No    Lack of Transportation (Non-Medical): No  Physical Activity: Not on file  Stress: Not on file  Social Connections: Not on file  Intimate Partner Violence: Not At Risk (02/01/2022)   Humiliation, Afraid, Rape, and Kick questionnaire    Fear of Current or Ex-Partner: No    Emotionally Abused: No    Physically Abused: No  Sexually Abused: No    FAMILY HISTORY: Family History  Problem Relation Age of Onset   Breast cancer Paternal Aunt     ALLERGIES:  is allergic to amoxicillin, hydrochlorothiazide, keflex [cephalexin], and macrodantin [nitrofurantoin macrocrystal].  MEDICATIONS:  Current Outpatient Medications  Medication Sig Dispense Refill   acetaminophen (TYLENOL) 500 MG tablet Take 1,000 mg by mouth every 6 (six) hours as needed for moderate pain. Takes 2 tablets three times a day     amLODipine (NORVASC) 5 MG tablet Take 1 tablet (5 mg total) by mouth daily.     calcium carbonate (OS-CAL) 600 MG TABS Take 600 mg by mouth 2 (two) times daily with a meal.     levothyroxine  (SYNTHROID, LEVOTHROID) 50 MCG tablet Take 50 mcg by mouth daily.     losartan (COZAAR) 100 MG tablet Take 100 mg by mouth daily.     lovastatin (MEVACOR) 20 MG tablet Take 20 mg by mouth at bedtime.     magnesium oxide (MAG-OX) 400 MG tablet Take 400 mg by mouth daily.     Multiple Vitamin (MULTIVITAMIN WITH MINERALS) TABS Take 1 tablet by mouth daily.     raloxifene (EVISTA) 60 MG tablet Take 60 mg by mouth daily.     No current facility-administered medications for this visit.    REVIEW OF SYSTEMS:   Constitutional: ( - ) fevers, ( - )  chills , ( - ) night sweats Eyes: ( - ) blurriness of vision, ( - ) double vision, ( - ) watery eyes Ears, nose, mouth, throat, and face: ( - ) mucositis, ( - ) sore throat Respiratory: ( - ) cough, ( - ) dyspnea, ( - ) wheezes Cardiovascular: ( - ) palpitation, ( - ) chest discomfort, ( - ) lower extremity swelling Gastrointestinal:  ( - ) nausea, ( - ) heartburn, ( - ) change in bowel habits Skin: ( - ) abnormal skin rashes Lymphatics: ( - ) new lymphadenopathy, ( - ) easy bruising Neurological: ( - ) numbness, ( - ) tingling, ( - ) new weaknesses Behavioral/Psych: ( - ) mood change, ( - ) new changes  All other systems were reviewed with the patient and are negative.  PHYSICAL EXAMINATION:  There were no vitals filed for this visit. There were no vitals filed for this visit.  GENERAL: Well-appearing elderly Caucasian female, alert, no distress and comfortable SKIN: skin color, texture, turgor are normal, no rashes or significant lesions EYES: conjunctiva are pink and non-injected, sclera clear LUNGS: clear to auscultation and percussion with normal breathing effort HEART: regular rate & rhythm and no murmurs and no lower extremity edema ABDOMEN: soft, non-tender, non-distended, normal bowel sounds Musculoskeletal: no cyanosis of digits and no clubbing  PSYCH: alert & oriented x 3, fluent speech NEURO: no focal motor/sensory  deficits  LABORATORY DATA:  I have reviewed the data as listed    Latest Ref Rng & Units 02/01/2022    2:45 PM 09/21/2021   11:06 AM 12/03/2011    5:00 AM  CBC  WBC 4.0 - 10.5 K/uL 5.6  5.7  12.9   Hemoglobin 12.0 - 15.0 g/dL 16.1  09.6  04.5   Hematocrit 36.0 - 46.0 % 32.9  36.1  30.7   Platelets 150 - 400 K/uL 113  94  181        Latest Ref Rng & Units 02/01/2022    2:45 PM 09/21/2021   11:06 AM 12/03/2011    5:00 AM  CMP  Glucose 70 - 99 mg/dL 85  191  99   BUN 8 - 23 mg/dL 34  19  14   Creatinine 0.44 - 1.00 mg/dL 4.78  2.95  6.21   Sodium 135 - 145 mmol/L 136  138  138   Potassium 3.5 - 5.1 mmol/L 4.5  4.0  4.8   Chloride 98 - 111 mmol/L 102  102  105   CO2 22 - 32 mmol/L Calcium 8.9 - 10.3 mg/dL 8.9  9.0  8.6   Total Protein 6.5 - 8.1 g/dL 6.9     Total Bilirubin 0.3 - 1.2 mg/dL 0.5     Alkaline Phos 38 - 126 U/L 42     AST 15 - 41 U/L 19     ALT 0 - 44 U/L 14       Lab Results  Component Value Date   MPROTEIN Not Observed 02/01/2022   Lab Results  Component Value Date   KPAFRELGTCHN 19.8 (H) 02/01/2022   LAMBDASER 12.9 02/01/2022   KAPLAMBRATIO 1.53 02/01/2022    RADIOGRAPHIC STUDIES: No results found.  ASSESSMENT & PLAN Stacey Stevens 89 y.o. female with medical history significant for macrocytic anemia/thrombocytopenia who presents for a follow up visit.   #Thrombocytopenia #Macrocytic anemia # Vitamin B12 deficiency  --Labs show white blood cell 6.2, hemoglobin 11.6, MCV 105.3, and platelets of 128 --Lab appear to be trending in the right direction. --Labs appear to be improving with vitamin B12 supplementation. --continue monthly vitamin b12 injections.  --Encourage patient to eat vitamin B12 rich diet including liver and red meat. --RTC in 3 months to reevaluate.    No orders of the defined types were placed in this encounter.   All questions were answered. The patient knows to call the clinic with any problems, questions or  concerns.  A total of more than 30 minutes were spent on this encounter with face-to-face time and non-face-to-face time, including preparing to see the patient, ordering tests and/or medications, counseling the patient and coordination of care as outlined above.   Ulysees Barns, MD Department of Hematology/Oncology [facility] at [facility] Phone: [phone] Pager: [phone] Email: [email or web address]  04/24/2022 2:02 PM

## 2022-04-25 ENCOUNTER — Inpatient Hospital Stay: Payer: PPO | Attending: Physician Assistant

## 2022-04-25 ENCOUNTER — Inpatient Hospital Stay (HOSPITAL_BASED_OUTPATIENT_CLINIC_OR_DEPARTMENT_OTHER): Payer: PPO | Admitting: Hematology and Oncology

## 2022-04-25 VITALS — BP 166/80 | HR 79 | Temp 97.9°F | Resp 18 | Wt 121.3 lb

## 2022-04-25 DIAGNOSIS — D539 Nutritional anemia, unspecified: Secondary | ICD-10-CM | POA: Diagnosis not present

## 2022-04-25 DIAGNOSIS — D696 Thrombocytopenia, unspecified: Secondary | ICD-10-CM

## 2022-04-25 DIAGNOSIS — E538 Deficiency of other specified B group vitamins: Secondary | ICD-10-CM | POA: Insufficient documentation

## 2022-04-25 LAB — CMP (CANCER CENTER ONLY)
ALT: 14 U/L (ref 0–44)
AST: 19 U/L (ref 15–41)
Albumin: 4.9 g/dL (ref 3.5–5.0)
Alkaline Phosphatase: 49 U/L (ref 38–126)
Anion gap: 7 (ref 5–15)
BUN: 26 mg/dL — ABNORMAL HIGH (ref 8–23)
CO2: 30 mmol/L (ref 22–32)
Calcium: 9.7 mg/dL (ref 8.9–10.3)
Chloride: 101 mmol/L (ref 98–111)
Creatinine: 1 mg/dL (ref 0.44–1.00)
GFR, Estimated: 53 mL/min — ABNORMAL LOW (ref >60)
Glucose, Bld: 96 mg/dL (ref 70–99)
Potassium: 4.3 mmol/L (ref 3.5–5.1)
Sodium: 138 mmol/L (ref 135–145)
Total Bilirubin: 0.5 mg/dL (ref 0.3–1.2)
Total Protein: 7.7 g/dL (ref 6.5–8.1)

## 2022-04-25 LAB — CBC WITH DIFFERENTIAL (CANCER CENTER ONLY)
Abs Immature Granulocytes: 0.32 10*3/uL — ABNORMAL HIGH (ref 0.00–0.07)
Basophils Absolute: 0.1 10*3/uL (ref 0.0–0.1)
Basophils Relative: 1 %
Eosinophils Absolute: 0 10*3/uL (ref 0.0–0.5)
Eosinophils Relative: 0 %
HCT: 35.8 % — ABNORMAL LOW (ref 36.0–46.0)
Hemoglobin: 11.6 g/dL — ABNORMAL LOW (ref 12.0–15.0)
Immature Granulocytes: 5 %
Lymphocytes Relative: 27 %
Lymphs Abs: 1.7 10*3/uL (ref 0.7–4.0)
MCH: 34.1 pg — ABNORMAL HIGH (ref 26.0–34.0)
MCHC: 32.4 g/dL (ref 30.0–36.0)
MCV: 105.3 fL — ABNORMAL HIGH (ref 80.0–100.0)
Monocytes Absolute: 1.1 10*3/uL — ABNORMAL HIGH (ref 0.1–1.0)
Monocytes Relative: 18 %
Neutro Abs: 3 10*3/uL (ref 1.7–7.7)
Neutrophils Relative %: 49 %
Platelet Count: 128 10*3/uL — ABNORMAL LOW (ref 150–400)
RBC: 3.4 MIL/uL — ABNORMAL LOW (ref 3.87–5.11)
RDW: 14.9 % (ref 11.5–15.5)
WBC Count: 6.2 10*3/uL (ref 4.0–10.5)
nRBC: 0 % (ref 0.0–0.2)

## 2022-04-25 LAB — VITAMIN B12: Vitamin B-12: 360 pg/mL (ref 180–914)

## 2022-04-25 LAB — LACTATE DEHYDROGENASE: LDH: 196 U/L — ABNORMAL HIGH (ref 98–192)

## 2022-05-02 ENCOUNTER — Ambulatory Visit: Payer: PPO | Admitting: Hematology and Oncology

## 2022-05-02 ENCOUNTER — Other Ambulatory Visit: Payer: PPO

## 2022-05-02 DIAGNOSIS — E538 Deficiency of other specified B group vitamins: Secondary | ICD-10-CM | POA: Diagnosis not present

## 2022-05-02 LAB — METHYLMALONIC ACID, SERUM: Methylmalonic Acid, Quantitative: 445 nmol/L — ABNORMAL HIGH (ref 0–378)

## 2022-05-18 DIAGNOSIS — E039 Hypothyroidism, unspecified: Secondary | ICD-10-CM | POA: Diagnosis not present

## 2022-05-31 DIAGNOSIS — E538 Deficiency of other specified B group vitamins: Secondary | ICD-10-CM | POA: Diagnosis not present

## 2022-06-28 DIAGNOSIS — E538 Deficiency of other specified B group vitamins: Secondary | ICD-10-CM | POA: Diagnosis not present

## 2022-07-25 DIAGNOSIS — E538 Deficiency of other specified B group vitamins: Secondary | ICD-10-CM | POA: Diagnosis not present

## 2022-07-27 ENCOUNTER — Other Ambulatory Visit: Payer: Self-pay

## 2022-07-27 ENCOUNTER — Other Ambulatory Visit: Payer: Self-pay | Admitting: Hematology and Oncology

## 2022-07-27 ENCOUNTER — Inpatient Hospital Stay: Payer: PPO | Admitting: Hematology and Oncology

## 2022-07-27 ENCOUNTER — Inpatient Hospital Stay: Payer: PPO | Attending: Hematology and Oncology

## 2022-07-27 VITALS — BP 183/79 | HR 90 | Temp 97.9°F | Resp 16 | Wt 119.8 lb

## 2022-07-27 DIAGNOSIS — E538 Deficiency of other specified B group vitamins: Secondary | ICD-10-CM | POA: Insufficient documentation

## 2022-07-27 DIAGNOSIS — D539 Nutritional anemia, unspecified: Secondary | ICD-10-CM | POA: Insufficient documentation

## 2022-07-27 DIAGNOSIS — D696 Thrombocytopenia, unspecified: Secondary | ICD-10-CM | POA: Insufficient documentation

## 2022-07-27 LAB — CBC WITH DIFFERENTIAL (CANCER CENTER ONLY)
Abs Immature Granulocytes: 0.17 10*3/uL — ABNORMAL HIGH (ref 0.00–0.07)
Basophils Absolute: 0.1 10*3/uL (ref 0.0–0.1)
Basophils Relative: 2 %
Eosinophils Absolute: 0 10*3/uL (ref 0.0–0.5)
Eosinophils Relative: 0 %
HCT: 35.3 % — ABNORMAL LOW (ref 36.0–46.0)
Hemoglobin: 11.6 g/dL — ABNORMAL LOW (ref 12.0–15.0)
Immature Granulocytes: 3 %
Lymphocytes Relative: 34 %
Lymphs Abs: 1.7 10*3/uL (ref 0.7–4.0)
MCH: 34.1 pg — ABNORMAL HIGH (ref 26.0–34.0)
MCHC: 32.9 g/dL (ref 30.0–36.0)
MCV: 103.8 fL — ABNORMAL HIGH (ref 80.0–100.0)
Monocytes Absolute: 0.6 10*3/uL (ref 0.1–1.0)
Monocytes Relative: 13 %
Neutro Abs: 2.4 10*3/uL (ref 1.7–7.7)
Neutrophils Relative %: 48 %
Platelet Count: 114 10*3/uL — ABNORMAL LOW (ref 150–400)
RBC: 3.4 MIL/uL — ABNORMAL LOW (ref 3.87–5.11)
RDW: 15.6 % — ABNORMAL HIGH (ref 11.5–15.5)
WBC Count: 5 10*3/uL (ref 4.0–10.5)
nRBC: 0 % (ref 0.0–0.2)

## 2022-07-27 LAB — CMP (CANCER CENTER ONLY)
ALT: 14 U/L (ref 0–44)
AST: 19 U/L (ref 15–41)
Albumin: 4.5 g/dL (ref 3.5–5.0)
Alkaline Phosphatase: 52 U/L (ref 38–126)
Anion gap: 8 (ref 5–15)
BUN: 28 mg/dL — ABNORMAL HIGH (ref 8–23)
CO2: 27 mmol/L (ref 22–32)
Calcium: 9.4 mg/dL (ref 8.9–10.3)
Chloride: 104 mmol/L (ref 98–111)
Creatinine: 1.01 mg/dL — ABNORMAL HIGH (ref 0.44–1.00)
GFR, Estimated: 53 mL/min — ABNORMAL LOW (ref >60)
Glucose, Bld: 120 mg/dL — ABNORMAL HIGH (ref 70–99)
Potassium: 4.7 mmol/L (ref 3.5–5.1)
Sodium: 139 mmol/L (ref 135–145)
Total Bilirubin: 0.6 mg/dL (ref 0.3–1.2)
Total Protein: 7.1 g/dL (ref 6.5–8.1)

## 2022-07-27 LAB — VITAMIN B12: Vitamin B-12: 1823 pg/mL — ABNORMAL HIGH (ref 180–914)

## 2022-07-27 NOTE — Progress Notes (Signed)
[facility] Telephone:([phone]) [phone]   Fax:([phone]  PROGRESS NOTE  Patient Care Team: Irven Coe, MD as PCP - General (Family Medicine)  Hematological/Oncological History # Macrocytic Anemia # Thrombocytopenia  Labs from PCP, Dr. Irven Coe  03/24/2021: WBC 5.5, Hgb 12.9, MCV 101.0 (H), Plt 144 (L) 01/05/2022: WBC 4.1, Hgb 11.4 (L), MCV 104.7 (H), Plt 100 (L) 01/22/2022: Establish care with [facility]ematology   Interval History:  Stacey Stevens 89 y.o. female with medical history significant for macrocytic anemia/thrombocytopenia who presents for a follow up visit. The patient's last visit was on 04/25/2022. In the interim since the last visit she has continued on vitamin B12 shots with Dr. Lewie Chamber.  On exam today Stacey Stevens reports her energy levels are improving.  She continues receiving vitamin B12 injections once every 4 weeks with Dr. Lewie Chamber.  She notes that her energy today is about a 5 out of 10.  Even though her energy is improving she still "does not have much energy or appetite".  She reports that she has not had any recent infectious symptoms such as runny nose, sore throat, or cough.  She denies any bleeding, bruising, or dark stools.  Overall she is at her baseline level of health with no major changes in the interim since her last visit.  Otherwise denies any fevers, chills, sweats, nausea, vomiting or diarrhea.  A full 10 point ROS is otherwise negative.  MEDICAL HISTORY:  Past Medical History:  Diagnosis Date   GERD (gastroesophageal reflux disease)    High blood cholesterol    Hypercholesteremia    Hypertension    Hypothyroidism    Osteoporosis     SURGICAL HISTORY: Past Surgical History:  Procedure Laterality Date   CATARACT EXTRACTION, BILATERAL      SOCIAL HISTORY: Social History   Socioeconomic History   Marital status: Widowed    Spouse name: Not on file   Number of children: Not on file   Years of education: Not on file   Highest  education level: Not on file  Occupational History   Not on file  Tobacco Use   Smoking status: Never   Smokeless tobacco: Never  Substance and Sexual Activity   Alcohol use: No   Drug use: No   Sexual activity: Not on file  Other Topics Concern   Not on file  Social History Narrative   Not on file   Social Determinants of Health   Financial Resource Strain: Not on file  Food Insecurity: No Food Insecurity (02/01/2022)   Hunger Vital Sign    Worried About Running Out of Food in the Last Year: Never true    Ran Out of Food in the Last Year: Never true  Transportation Needs: No Transportation Needs (02/01/2022)   PRAPARE - Administrator, Civil Service (Medical): No    Lack of Transportation (Non-Medical): No  Physical Activity: Not on file  Stress: Not on file  Social Connections: Not on file  Intimate Partner Violence: Not At Risk (02/01/2022)   Humiliation, Afraid, Rape, and Kick questionnaire    Fear of Current or Ex-Partner: No    Emotionally Abused: No    Physically Abused: No    Sexually Abused: No    FAMILY HISTORY: Family History  Problem Relation Age of Onset   Breast cancer Paternal Aunt     ALLERGIES:  is allergic to amoxicillin, hydrochlorothiazide, keflex [cephalexin], and macrodantin [nitrofurantoin macrocrystal].  MEDICATIONS:  Current Outpatient Medications  Medication  Sig Dispense Refill   acetaminophen (TYLENOL) 500 MG tablet Take 1,000 mg by mouth every 6 (six) hours as needed for moderate pain. Takes 2 tablets three times a day     amLODipine (NORVASC) 5 MG tablet Take 1 tablet (5 mg total) by mouth daily.     calcium carbonate (OS-CAL) 600 MG TABS Take 600 mg by mouth 2 (two) times daily with a meal.     levothyroxine (SYNTHROID, LEVOTHROID) 50 MCG tablet Take 50 mcg by mouth daily.     losartan (COZAAR) 100 MG tablet Take 100 mg by mouth daily.     lovastatin (MEVACOR) 20 MG tablet Take 20 mg by mouth at bedtime.     magnesium oxide  (MAG-OX) 400 MG tablet Take 400 mg by mouth daily.     Multiple Vitamin (MULTIVITAMIN WITH MINERALS) TABS Take 1 tablet by mouth daily.     raloxifene (EVISTA) 60 MG tablet Take 60 mg by mouth daily.     No current facility-administered medications for this visit.    REVIEW OF SYSTEMS:   Constitutional: ( - ) fevers, ( - )  chills , ( - ) night sweats Eyes: ( - ) blurriness of vision, ( - ) double vision, ( - ) watery eyes Ears, nose, mouth, throat, and face: ( - ) mucositis, ( - ) sore throat Respiratory: ( - ) cough, ( - ) dyspnea, ( - ) wheezes Cardiovascular: ( - ) palpitation, ( - ) chest discomfort, ( - ) lower extremity swelling Gastrointestinal:  ( - ) nausea, ( - ) heartburn, ( - ) change in bowel habits Skin: ( - ) abnormal skin rashes Lymphatics: ( - ) new lymphadenopathy, ( - ) easy bruising Neurological: ( - ) numbness, ( - ) tingling, ( - ) new weaknesses Behavioral/Psych: ( - ) mood change, ( - ) new changes  All other systems were reviewed with the patient and are negative.  PHYSICAL EXAMINATION:  Vitals:   07/27/22 1054  BP: (!) 183/79  Pulse: 90  Resp: 16  Temp: 97.9 F (36.6 C)  SpO2: 100%   Filed Weights   07/27/22 1054  Weight: 119 lb 12.8 oz (54.3 kg)    GENERAL: Well-appearing elderly Caucasian female, alert, no distress and comfortable SKIN: skin color, texture, turgor are normal, no rashes or significant lesions EYES: conjunctiva are pink and non-injected, sclera clear LUNGS: clear to auscultation and percussion with normal breathing effort HEART: regular rate & rhythm and no murmurs and no lower extremity edema ABDOMEN: soft, non-tender, non-distended, normal bowel sounds Musculoskeletal: no cyanosis of digits and no clubbing  PSYCH: alert & oriented x 3, fluent speech NEURO: no focal motor/sensory deficits  LABORATORY DATA:  I have reviewed the data as listed    Latest Ref Rng & Units 07/27/2022   10:26 AM 04/25/2022   11:13 AM 02/01/2022     2:45 PM  CBC  WBC 4.0 - 10.5 K/uL 5.0  6.2  5.6   Hemoglobin 12.0 - 15.0 g/dL 57.8  46.9  62.9   Hematocrit 36.0 - 46.0 % 35.3  35.8  32.9   Platelets 150 - 400 K/uL 114  128  113        Latest Ref Rng & Units 07/27/2022   10:26 AM 04/25/2022   11:13 AM 02/01/2022    2:45 PM  CMP  Glucose 70 - 99 mg/dL 528  96  85   BUN 8 - 23 mg/dL 28  26  34   Creatinine 0.44 - 1.00 mg/dL 1.61  0.96  0.45   Sodium 135 - 145 mmol/L 139  138  136   Potassium 3.5 - 5.1 mmol/L 4.7  4.3  4.5   Chloride 98 - 111 mmol/L 104  101  102   CO2 22 - 32 mmol/L 27  30  26    Calcium 8.9 - 10.3 mg/dL 9.4  9.7  8.9   Total Protein 6.5 - 8.1 g/dL 7.1  7.7  6.9   Total Bilirubin 0.3 - 1.2 mg/dL 0.6  0.5  0.5   Alkaline Phos 38 - 126 U/L 52  49  42   AST 15 - 41 U/L 19  19  19    ALT 0 - 44 U/L 14  14  14      Lab Results  Component Value Date   MPROTEIN Not Observed 02/01/2022   Lab Results  Component Value Date   KPAFRELGTCHN 19.8 (H) 02/01/2022   LAMBDASER 12.9 02/01/2022   KAPLAMBRATIO 1.53 02/01/2022    RADIOGRAPHIC STUDIES: No results found.  ASSESSMENT & PLAN LEVIE OWENSBY 89 y.o. female with medical history significant for macrocytic anemia/thrombocytopenia who presents for a follow up visit.   #Thrombocytopenia #Macrocytic anemia # Vitamin B12 deficiency  --Labs show white blood cell 5.0, hemoglobin 11.6, MCV 103.8, and platelets of 114. --Labs appeared to be improving with vitamin B12 supplementation but have leveled off on today's exam. --continue monthly vitamin b12 injections.  --Encourage patient to eat vitamin B12 rich diet including liver and red meat. --If nutritional levels appear replete today would recommend consideration of bone marrow biopsy.  Given her advanced age observation would also be appropriate at this time. --RTC in 6 months to reevaluate.    No orders of the defined types were placed in this encounter.   All questions were answered. The patient knows to call the  clinic with any problems, questions or concerns.  A total of more than 30 minutes were spent on this encounter with face-to-face time and non-face-to-face time, including preparing to see the patient, ordering tests and/or medications, counseling the patient and coordination of care as outlined above.   Ulysees Barns, MD Department of Hematology/Oncology [facility] at [facility] Phone: [phone] Pager: [phone] Email: [email or web address]  07/27/2022 5:14 PM

## 2022-08-06 DIAGNOSIS — E785 Hyperlipidemia, unspecified: Secondary | ICD-10-CM | POA: Diagnosis not present

## 2022-08-06 DIAGNOSIS — K219 Gastro-esophageal reflux disease without esophagitis: Secondary | ICD-10-CM | POA: Diagnosis not present

## 2022-08-06 DIAGNOSIS — M810 Age-related osteoporosis without current pathological fracture: Secondary | ICD-10-CM | POA: Diagnosis not present

## 2022-08-06 DIAGNOSIS — G8929 Other chronic pain: Secondary | ICD-10-CM | POA: Diagnosis not present

## 2022-08-06 DIAGNOSIS — I10 Essential (primary) hypertension: Secondary | ICD-10-CM | POA: Diagnosis not present

## 2022-08-06 DIAGNOSIS — E039 Hypothyroidism, unspecified: Secondary | ICD-10-CM | POA: Diagnosis not present

## 2022-08-26 DIAGNOSIS — E538 Deficiency of other specified B group vitamins: Secondary | ICD-10-CM | POA: Diagnosis not present

## 2022-09-21 ENCOUNTER — Other Ambulatory Visit: Payer: Self-pay | Admitting: Family Medicine

## 2022-09-21 DIAGNOSIS — Z1231 Encounter for screening mammogram for malignant neoplasm of breast: Secondary | ICD-10-CM

## 2022-10-04 DIAGNOSIS — E538 Deficiency of other specified B group vitamins: Secondary | ICD-10-CM | POA: Diagnosis not present

## 2022-10-04 DIAGNOSIS — Z23 Encounter for immunization: Secondary | ICD-10-CM | POA: Diagnosis not present

## 2022-10-11 DIAGNOSIS — H40013 Open angle with borderline findings, low risk, bilateral: Secondary | ICD-10-CM | POA: Diagnosis not present

## 2022-10-14 ENCOUNTER — Ambulatory Visit
Admission: RE | Admit: 2022-10-14 | Discharge: 2022-10-14 | Disposition: A | Discharge status: Home / Self Care | Payer: PPO | Source: Ambulatory Visit | Attending: Family Medicine | Admitting: Family Medicine

## 2022-10-14 DIAGNOSIS — Z1231 Encounter for screening mammogram for malignant neoplasm of breast: Secondary | ICD-10-CM | POA: Diagnosis not present

## 2022-10-26 ENCOUNTER — Inpatient Hospital Stay: Payer: PPO | Admitting: Hematology and Oncology

## 2022-10-26 ENCOUNTER — Other Ambulatory Visit: Payer: Self-pay | Admitting: Hematology and Oncology

## 2022-10-26 ENCOUNTER — Inpatient Hospital Stay: Payer: PPO | Attending: Hematology and Oncology

## 2022-10-26 ENCOUNTER — Other Ambulatory Visit: Payer: Self-pay

## 2022-10-26 VITALS — BP 156/74 | HR 81 | Temp 98.1°F | Resp 16 | Wt 119.8 lb

## 2022-10-26 DIAGNOSIS — E538 Deficiency of other specified B group vitamins: Secondary | ICD-10-CM

## 2022-10-26 DIAGNOSIS — D696 Thrombocytopenia, unspecified: Secondary | ICD-10-CM | POA: Insufficient documentation

## 2022-10-26 DIAGNOSIS — D539 Nutritional anemia, unspecified: Secondary | ICD-10-CM | POA: Insufficient documentation

## 2022-10-26 LAB — CMP (CANCER CENTER ONLY)
ALT: 11 U/L (ref 0–44)
AST: 17 U/L (ref 15–41)
Albumin: 4.6 g/dL (ref 3.5–5.0)
Alkaline Phosphatase: 59 U/L (ref 38–126)
Anion gap: 7 (ref 5–15)
BUN: 36 mg/dL — ABNORMAL HIGH (ref 8–23)
CO2: 27 mmol/L (ref 22–32)
Calcium: 9.4 mg/dL (ref 8.9–10.3)
Chloride: 104 mmol/L (ref 98–111)
Creatinine: 1.01 mg/dL — ABNORMAL HIGH (ref 0.44–1.00)
GFR, Estimated: 52 mL/min — ABNORMAL LOW (ref >60)
Glucose, Bld: 121 mg/dL — ABNORMAL HIGH (ref 70–99)
Potassium: 4.3 mmol/L (ref 3.5–5.1)
Sodium: 138 mmol/L (ref 135–145)
Total Bilirubin: 0.6 mg/dL (ref 0.3–1.2)
Total Protein: 7.5 g/dL (ref 6.5–8.1)

## 2022-10-26 LAB — CBC WITH DIFFERENTIAL (CANCER CENTER ONLY)
Abs Immature Granulocytes: 0.27 10*3/uL — ABNORMAL HIGH (ref 0.00–0.07)
Basophils Absolute: 0.1 10*3/uL (ref 0.0–0.1)
Basophils Relative: 1 %
Eosinophils Absolute: 0 10*3/uL (ref 0.0–0.5)
Eosinophils Relative: 1 %
HCT: 33.4 % — ABNORMAL LOW (ref 36.0–46.0)
Hemoglobin: 10.8 g/dL — ABNORMAL LOW (ref 12.0–15.0)
Immature Granulocytes: 5 %
Lymphocytes Relative: 29 %
Lymphs Abs: 1.7 10*3/uL (ref 0.7–4.0)
MCH: 33.2 pg (ref 26.0–34.0)
MCHC: 32.3 g/dL (ref 30.0–36.0)
MCV: 102.8 fL — ABNORMAL HIGH (ref 80.0–100.0)
Monocytes Absolute: 1.1 10*3/uL — ABNORMAL HIGH (ref 0.1–1.0)
Monocytes Relative: 19 %
Neutro Abs: 2.8 10*3/uL (ref 1.7–7.7)
Neutrophils Relative %: 45 %
Platelet Count: 141 10*3/uL — ABNORMAL LOW (ref 150–400)
RBC: 3.25 MIL/uL — ABNORMAL LOW (ref 3.87–5.11)
RDW: 15.3 % (ref 11.5–15.5)
WBC Count: 5.9 10*3/uL (ref 4.0–10.5)
nRBC: 0 % (ref 0.0–0.2)

## 2022-10-26 LAB — VITAMIN B12: Vitamin B-12: 685 pg/mL (ref 180–914)

## 2022-10-26 LAB — FOLATE: Folate: 19.9 ng/mL (ref >5.9)
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
## 2022-10-29 LAB — METHYLMALONIC ACID, SERUM: Methylmalonic Acid, Quantitative: 271 nmol/L (ref 0–378)

## 2022-11-01 DIAGNOSIS — E539 Vitamin B deficiency, unspecified: Secondary | ICD-10-CM | POA: Diagnosis not present

## 2022-11-29 DIAGNOSIS — E539 Vitamin B deficiency, unspecified: Secondary | ICD-10-CM | POA: Diagnosis not present

## 2022-12-05 DIAGNOSIS — Z23 Encounter for immunization: Secondary | ICD-10-CM | POA: Diagnosis not present

## 2022-12-14 DIAGNOSIS — E039 Hypothyroidism, unspecified: Secondary | ICD-10-CM | POA: Diagnosis not present

## 2022-12-14 DIAGNOSIS — E7800 Pure hypercholesterolemia, unspecified: Secondary | ICD-10-CM | POA: Diagnosis not present

## 2022-12-14 DIAGNOSIS — I10 Essential (primary) hypertension: Secondary | ICD-10-CM | POA: Diagnosis not present

## 2022-12-14 DIAGNOSIS — M810 Age-related osteoporosis without current pathological fracture: Secondary | ICD-10-CM | POA: Diagnosis not present

## 2022-12-16 DIAGNOSIS — M810 Age-related osteoporosis without current pathological fracture: Secondary | ICD-10-CM | POA: Diagnosis not present

## 2022-12-16 DIAGNOSIS — K219 Gastro-esophageal reflux disease without esophagitis: Secondary | ICD-10-CM | POA: Diagnosis not present

## 2022-12-16 DIAGNOSIS — Z0000 Encounter for general adult medical examination without abnormal findings: Secondary | ICD-10-CM | POA: Diagnosis not present

## 2022-12-16 DIAGNOSIS — E039 Hypothyroidism, unspecified: Secondary | ICD-10-CM | POA: Diagnosis not present

## 2022-12-16 DIAGNOSIS — E7800 Pure hypercholesterolemia, unspecified: Secondary | ICD-10-CM | POA: Diagnosis not present

## 2022-12-16 DIAGNOSIS — I10 Essential (primary) hypertension: Secondary | ICD-10-CM | POA: Diagnosis not present

## 2022-12-16 DIAGNOSIS — D696 Thrombocytopenia, unspecified: Secondary | ICD-10-CM | POA: Diagnosis not present

## 2022-12-16 DIAGNOSIS — J309 Allergic rhinitis, unspecified: Secondary | ICD-10-CM | POA: Diagnosis not present

## 2022-12-27 DIAGNOSIS — E539 Vitamin B deficiency, unspecified: Secondary | ICD-10-CM | POA: Diagnosis not present

## 2023-03-27 DIAGNOSIS — E539 Vitamin B deficiency, unspecified: Secondary | ICD-10-CM | POA: Diagnosis not present

## 2023-04-12 ENCOUNTER — Inpatient Hospital Stay: Payer: PPO | Admitting: Hematology and Oncology

## 2023-04-12 ENCOUNTER — Other Ambulatory Visit: Payer: Self-pay | Admitting: Hematology and Oncology

## 2023-04-12 ENCOUNTER — Inpatient Hospital Stay: Payer: PPO | Attending: Hematology and Oncology

## 2023-04-12 VITALS — BP 179/75 | HR 83 | Temp 97.3°F | Resp 15 | Wt 113.3 lb

## 2023-04-12 DIAGNOSIS — E538 Deficiency of other specified B group vitamins: Secondary | ICD-10-CM

## 2023-04-12 DIAGNOSIS — D539 Nutritional anemia, unspecified: Secondary | ICD-10-CM

## 2023-04-12 DIAGNOSIS — D696 Thrombocytopenia, unspecified: Secondary | ICD-10-CM | POA: Insufficient documentation

## 2023-04-12 LAB — FOLATE: Folate: 31.6 ng/mL (ref >5.9)

## 2023-04-12 LAB — CMP (CANCER CENTER ONLY)
ALT: 12 U/L (ref 0–44)
AST: 18 U/L (ref 15–41)
Albumin: 4.8 g/dL (ref 3.5–5.0)
Alkaline Phosphatase: 49 U/L (ref 38–126)
Anion gap: 10 (ref 5–15)
BUN: 21 mg/dL (ref 8–23)
CO2: 27 mmol/L (ref 22–32)
Calcium: 9.3 mg/dL (ref 8.9–10.3)
Chloride: 103 mmol/L (ref 98–111)
Creatinine: 1.11 mg/dL — ABNORMAL HIGH (ref 0.44–1.00)
GFR, Estimated: 47 mL/min — ABNORMAL LOW (ref >60)
Glucose, Bld: 106 mg/dL — ABNORMAL HIGH (ref 70–99)
Potassium: 4.1 mmol/L (ref 3.5–5.1)
Sodium: 140 mmol/L (ref 135–145)
Total Bilirubin: 0.7 mg/dL (ref 0.0–1.2)
Total Protein: 7.6 g/dL (ref 6.5–8.1)

## 2023-04-12 LAB — CBC WITH DIFFERENTIAL (CANCER CENTER ONLY)
Abs Immature Granulocytes: 0.31 10*3/uL — ABNORMAL HIGH (ref 0.00–0.07)
Basophils Absolute: 0.1 10*3/uL (ref 0.0–0.1)
Basophils Relative: 1 %
Eosinophils Absolute: 0 10*3/uL (ref 0.0–0.5)
Eosinophils Relative: 0 %
HCT: 34.6 % — ABNORMAL LOW (ref 36.0–46.0)
Hemoglobin: 11 g/dL — ABNORMAL LOW (ref 12.0–15.0)
Immature Granulocytes: 5 %
Lymphocytes Relative: 23 %
Lymphs Abs: 1.5 10*3/uL (ref 0.7–4.0)
MCH: 32.8 pg (ref 26.0–34.0)
MCHC: 31.8 g/dL (ref 30.0–36.0)
MCV: 103.3 fL — ABNORMAL HIGH (ref 80.0–100.0)
Monocytes Absolute: 1.4 10*3/uL — ABNORMAL HIGH (ref 0.1–1.0)
Monocytes Relative: 21 %
Neutro Abs: 3.3 10*3/uL (ref 1.7–7.7)
Neutrophils Relative %: 50 %
Platelet Count: 116 10*3/uL — ABNORMAL LOW (ref 150–400)
RBC: 3.35 MIL/uL — ABNORMAL LOW (ref 3.87–5.11)
RDW: 16.7 % — ABNORMAL HIGH (ref 11.5–15.5)
WBC Count: 6.5 10*3/uL (ref 4.0–10.5)
nRBC: 0 % (ref 0.0–0.2)

## 2023-04-12 LAB — VITAMIN B12: Vitamin B-12: 809 pg/mL (ref 180–914)

## 2023-04-12 LAB — LACTATE DEHYDROGENASE: LDH: 218 U/L — ABNORMAL HIGH (ref 98–192)
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
## 2023-04-15 LAB — METHYLMALONIC ACID, SERUM: Methylmalonic Acid, Quantitative: 191 nmol/L (ref 0–378)

## 2023-04-24 DIAGNOSIS — E539 Vitamin B deficiency, unspecified: Secondary | ICD-10-CM | POA: Diagnosis not present

## 2023-05-22 DIAGNOSIS — E539 Vitamin B deficiency, unspecified: Secondary | ICD-10-CM | POA: Diagnosis not present

## 2023-06-16 DIAGNOSIS — E539 Vitamin B deficiency, unspecified: Secondary | ICD-10-CM | POA: Diagnosis not present

## 2023-06-16 DIAGNOSIS — E039 Hypothyroidism, unspecified: Secondary | ICD-10-CM | POA: Diagnosis not present

## 2023-06-16 DIAGNOSIS — T7840XA Allergy, unspecified, initial encounter: Secondary | ICD-10-CM | POA: Diagnosis not present

## 2023-06-16 DIAGNOSIS — E7800 Pure hypercholesterolemia, unspecified: Secondary | ICD-10-CM | POA: Diagnosis not present

## 2023-06-16 DIAGNOSIS — D696 Thrombocytopenia, unspecified: Secondary | ICD-10-CM | POA: Diagnosis not present

## 2023-06-16 DIAGNOSIS — M810 Age-related osteoporosis without current pathological fracture: Secondary | ICD-10-CM | POA: Diagnosis not present

## 2023-06-16 DIAGNOSIS — I10 Essential (primary) hypertension: Secondary | ICD-10-CM | POA: Diagnosis not present

## 2023-06-16 DIAGNOSIS — M1990 Unspecified osteoarthritis, unspecified site: Secondary | ICD-10-CM | POA: Diagnosis not present

## 2023-06-18 IMAGING — MG MM DIGITAL SCREENING BILAT W/ TOMO AND CAD
8 series · 9 of 24 positions shown · non-contrast
Comparison: Previous exam(s).

CLINICAL DATA: Screening.

EXAM:
DIGITAL SCREENING BILATERAL MAMMOGRAM WITH TOMOSYNTHESIS AND CAD
TECHNIQUE: Bilateral screening digital craniocaudal and mediolateral oblique
mammograms were obtained. Bilateral screening digital breast
tomosynthesis was performed. The images were evaluated with
computer-aided detection.

[L CC synth-2D]
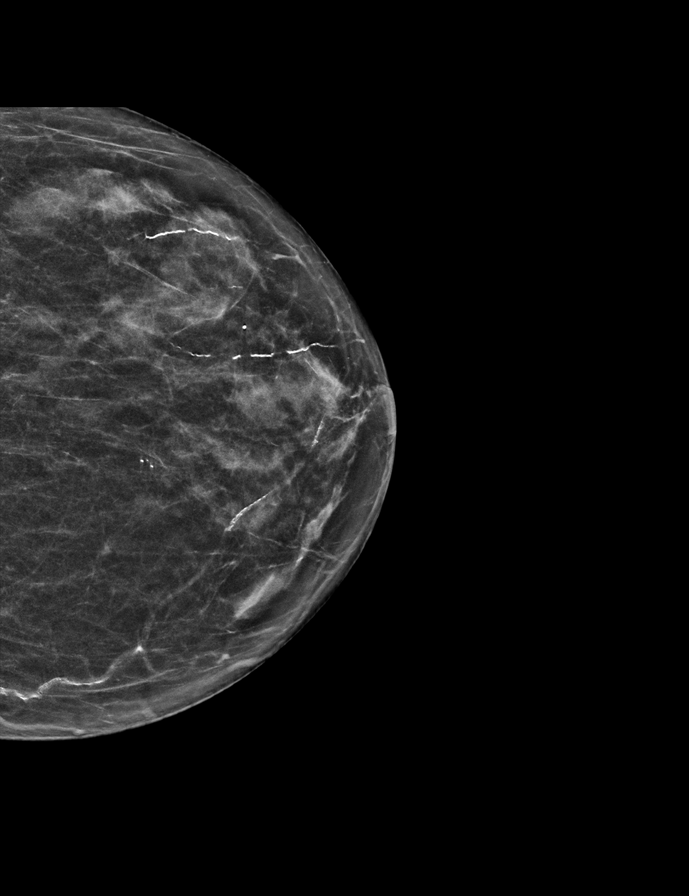

[R MLO synth-2D]
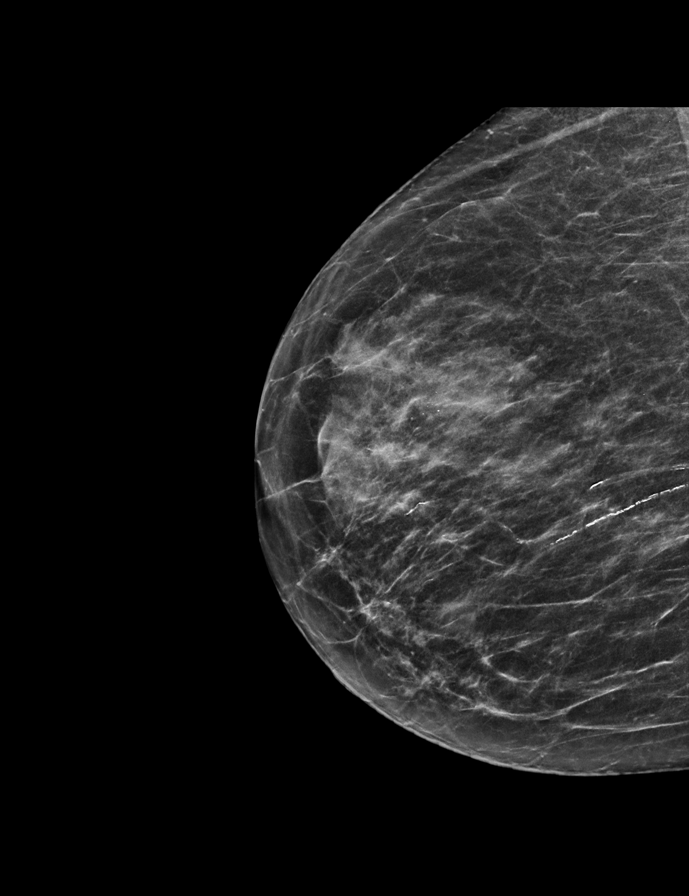

[L MLO synth-2D]
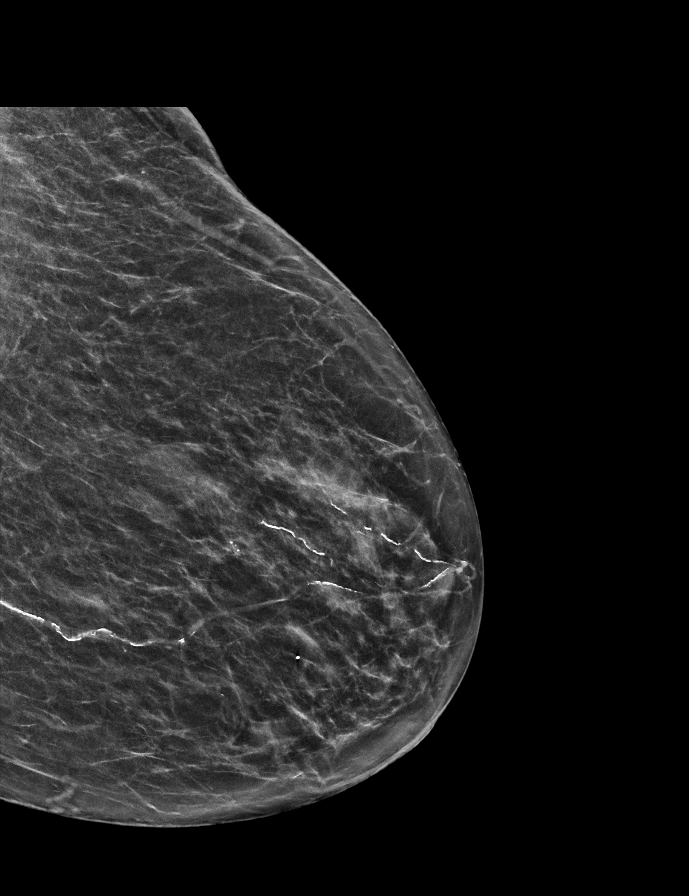

[R CC synth-2D]
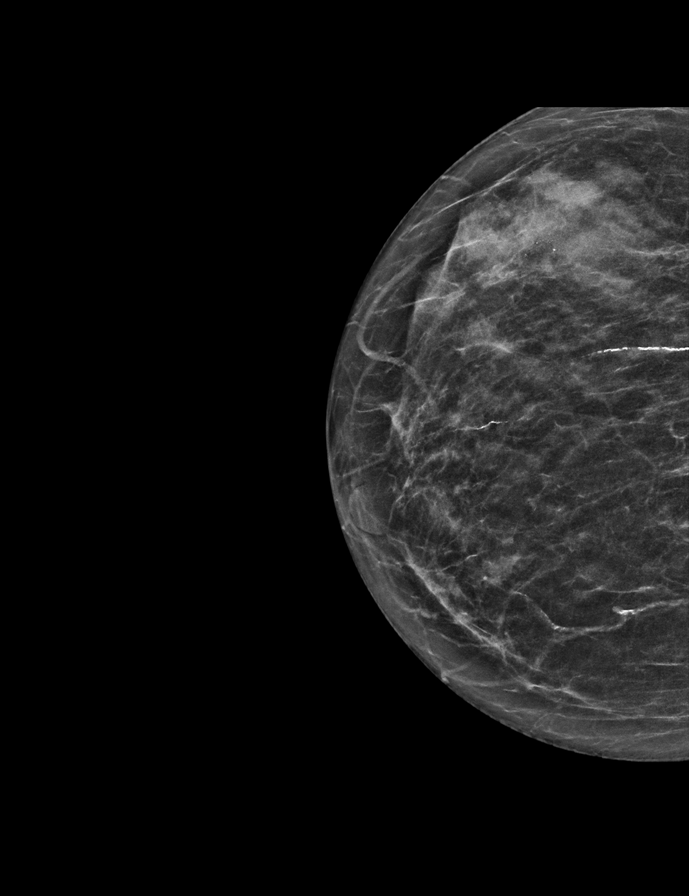

[R MLO tomo · 2 of 53 frames shown]
[frame 18/53]
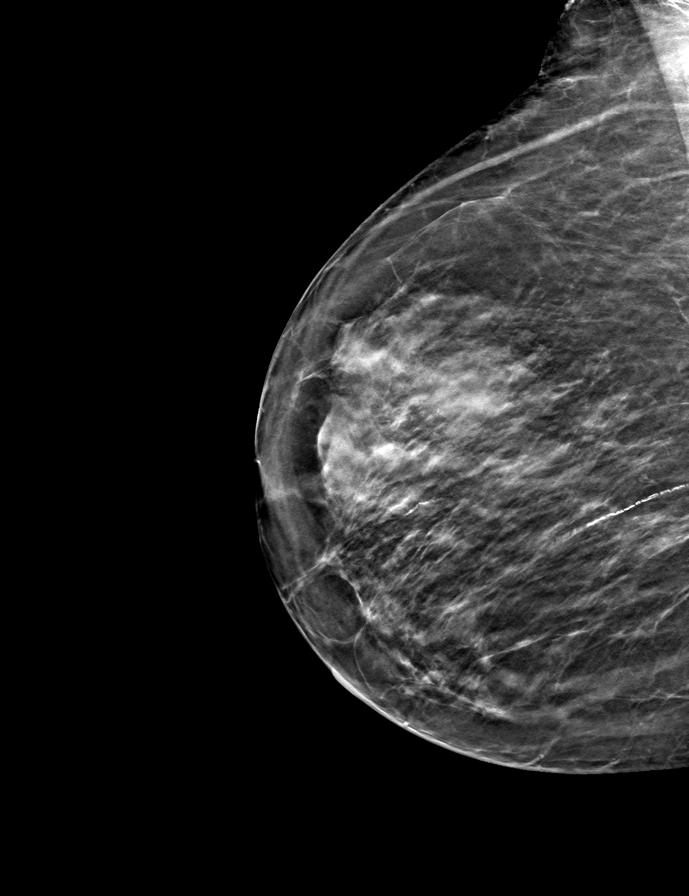
[frame 27/53]
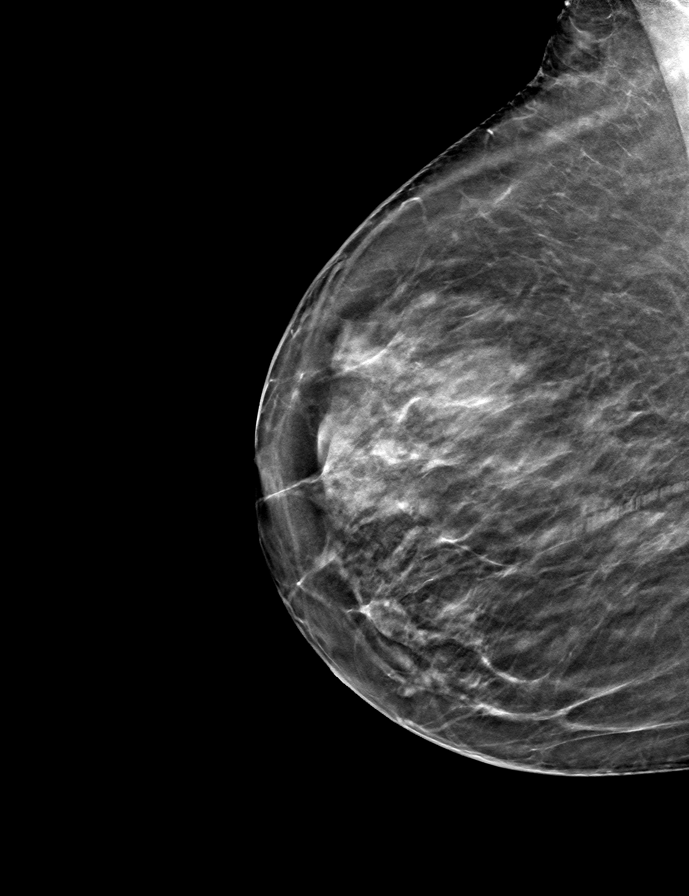

[L MLO tomo · tomo slice 27/52.0]
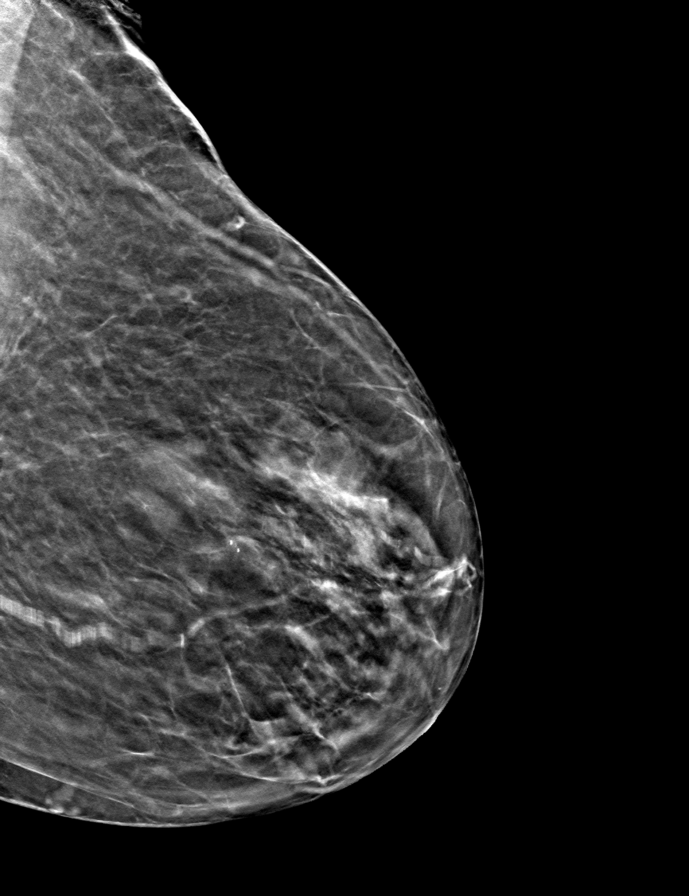

[L CC tomo · tomo slice 25/49.0]
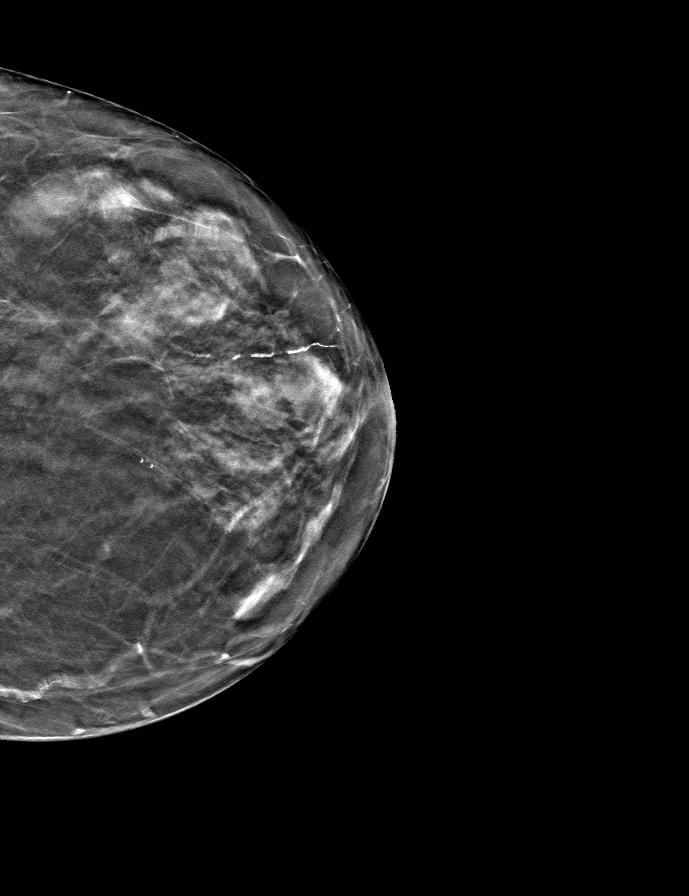

[R CC tomo · tomo slice 26/51.0]
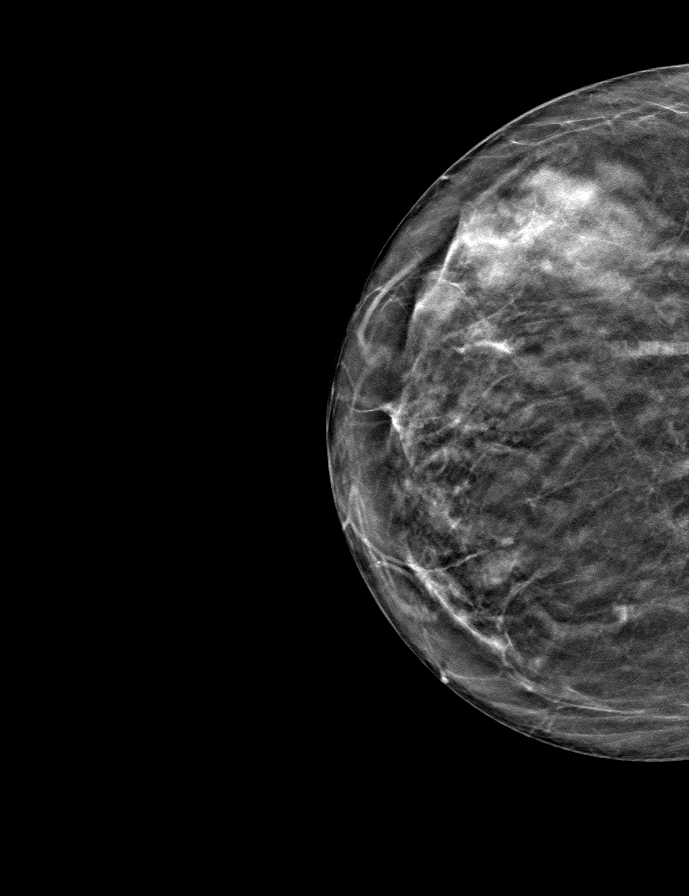

[9 of 24 positions shown; findings below may reference images not displayed]

ACR Breast Density Category b: There are scattered areas of
fibroglandular density.
FINDINGS: There are no findings suspicious for malignancy.
IMPRESSION: No mammographic evidence of malignancy. A result letter of this
screening mammogram will be mailed directly to the patient.

RECOMMENDATION:
Screening mammogram in one year. (Code:51-O-LD2)

BI-RADS CATEGORY  1: Negative.

## 2023-07-17 DIAGNOSIS — I10 Essential (primary) hypertension: Secondary | ICD-10-CM | POA: Diagnosis not present

## 2023-07-17 DIAGNOSIS — E039 Hypothyroidism, unspecified: Secondary | ICD-10-CM | POA: Diagnosis not present

## 2023-07-17 DIAGNOSIS — R21 Rash and other nonspecific skin eruption: Secondary | ICD-10-CM | POA: Diagnosis not present

## 2023-07-17 DIAGNOSIS — R609 Edema, unspecified: Secondary | ICD-10-CM | POA: Diagnosis not present

## 2023-07-17 DIAGNOSIS — D519 Vitamin B12 deficiency anemia, unspecified: Secondary | ICD-10-CM | POA: Diagnosis not present

## 2023-07-17 DIAGNOSIS — L299 Pruritus, unspecified: Secondary | ICD-10-CM | POA: Diagnosis not present

## 2023-07-17 DIAGNOSIS — D696 Thrombocytopenia, unspecified: Secondary | ICD-10-CM | POA: Diagnosis not present

## 2023-07-17 DIAGNOSIS — E7800 Pure hypercholesterolemia, unspecified: Secondary | ICD-10-CM | POA: Diagnosis not present

## 2023-07-17 DIAGNOSIS — I8310 Varicose veins of unspecified lower extremity with inflammation: Secondary | ICD-10-CM | POA: Diagnosis not present

## 2023-08-01 DIAGNOSIS — E039 Hypothyroidism, unspecified: Secondary | ICD-10-CM | POA: Diagnosis not present

## 2023-08-01 DIAGNOSIS — N1832 Chronic kidney disease, stage 3b: Secondary | ICD-10-CM | POA: Diagnosis not present

## 2023-09-06 DIAGNOSIS — E039 Hypothyroidism, unspecified: Secondary | ICD-10-CM | POA: Diagnosis not present

## 2023-10-18 DIAGNOSIS — Z961 Presence of intraocular lens: Secondary | ICD-10-CM | POA: Diagnosis not present

## 2023-10-18 DIAGNOSIS — H5212 Myopia, left eye: Secondary | ICD-10-CM | POA: Diagnosis not present

## 2023-12-09 ENCOUNTER — Emergency Department (HOSPITAL_COMMUNITY)

## 2023-12-09 ENCOUNTER — Encounter (HOSPITAL_COMMUNITY): Payer: Self-pay | Admitting: Pharmacy Technician

## 2023-12-09 ENCOUNTER — Other Ambulatory Visit: Payer: Self-pay

## 2023-12-09 ENCOUNTER — Emergency Department (HOSPITAL_COMMUNITY)
Admission: EM | Admit: 2023-12-09 | Discharge: 2023-12-09 | Disposition: A | Discharge status: Home / Self Care | Attending: Emergency Medicine | Admitting: Emergency Medicine

## 2023-12-09 DIAGNOSIS — Z79899 Other long term (current) drug therapy: Secondary | ICD-10-CM | POA: Diagnosis not present

## 2023-12-09 DIAGNOSIS — S199XXA Unspecified injury of neck, initial encounter: Secondary | ICD-10-CM | POA: Diagnosis not present

## 2023-12-09 DIAGNOSIS — W19XXXA Unspecified fall, initial encounter: Secondary | ICD-10-CM

## 2023-12-09 DIAGNOSIS — W01198A Fall on same level from slipping, tripping and stumbling with subsequent striking against other object, initial encounter: Secondary | ICD-10-CM | POA: Insufficient documentation

## 2023-12-09 DIAGNOSIS — S0083XA Contusion of other part of head, initial encounter: Secondary | ICD-10-CM | POA: Diagnosis not present

## 2023-12-09 DIAGNOSIS — Y93E2 Activity, laundry: Secondary | ICD-10-CM | POA: Insufficient documentation

## 2023-12-09 DIAGNOSIS — E039 Hypothyroidism, unspecified: Secondary | ICD-10-CM | POA: Diagnosis not present

## 2023-12-09 DIAGNOSIS — M5021 Other cervical disc displacement,  high cervical region: Secondary | ICD-10-CM | POA: Diagnosis not present

## 2023-12-09 DIAGNOSIS — S0990XA Unspecified injury of head, initial encounter: Secondary | ICD-10-CM | POA: Diagnosis not present

## 2023-12-09 DIAGNOSIS — I10 Essential (primary) hypertension: Secondary | ICD-10-CM | POA: Diagnosis not present

## 2023-12-09 DIAGNOSIS — I700 Atherosclerosis of aorta: Secondary | ICD-10-CM | POA: Diagnosis not present

## 2023-12-09 DIAGNOSIS — M47812 Spondylosis without myelopathy or radiculopathy, cervical region: Secondary | ICD-10-CM | POA: Diagnosis not present

## 2023-12-09 DIAGNOSIS — S0093XA Contusion of unspecified part of head, initial encounter: Secondary | ICD-10-CM

## 2023-12-09 DIAGNOSIS — S0003XA Contusion of scalp, initial encounter: Secondary | ICD-10-CM | POA: Diagnosis not present

## 2023-12-09 MED ORDER — ACETAMINOPHEN 500 MG PO TABS
1000.0000 mg | ORAL_TABLET | Freq: Once | ORAL | Status: AC
  Administered 2023-12-09: 1000 mg via ORAL
  Filled 2023-12-09: qty 2

## 2023-12-09 NOTE — ED Triage Notes (Signed)
 Pt was bending over to pick up laundry and she fell from her bent-over position and landing with her forehead hitting the hardwood floor.  No loc, blurred vision or nausea.  Hematoma noted over R eyebrow.  Pt is not on blood thinners.

## 2023-12-09 NOTE — ED Triage Notes (Signed)
 Pt here after getting tripped in her laundry and fell face forward onto the hardwood floor. Denies LOC. Not on anticoags.

## 2023-12-09 NOTE — ED Provider Notes (Signed)
 [place] [facility] AT [facility] Provider Note   CSN: [id number] Arrival date & time: 12/09/23  1404     Patient presents with: [place]   Stacey Stevens is a 89 y.o. female.   Pt is a 89 yo female with pmhx significant for htn, hld, gerd, osteoporosis, and hypothyroidism.  Pt was doing her laundry and got tripped in the clothes and fell face forward onto the hardwood floor.  No loc.  Pt denies any new pain.  No thinners.       Prior to Admission medications   Medication Sig Start Date End Date Taking? Authorizing Provider  acetaminophen  (TYLENOL ) 500 MG tablet Take 1,000 mg by mouth every 6 (six) hours as needed for moderate pain. Takes 2 tablets three times a day    [provider]  amLODipine  (NORVASC ) 5 MG tablet Take 1 tablet (5 mg total) by mouth daily. 12/03/11   Viyuoh, Adeline C, MD  calcium  carbonate (OS-CAL) 600 MG TABS Take 600 mg by mouth 2 (two) times daily with a meal.    [provider]  levothyroxine  (SYNTHROID , LEVOTHROID) 50 MCG tablet Take 50 mcg by mouth daily.    [provider]  losartan (COZAAR) 100 MG tablet Take 100 mg by mouth daily.    [provider]  lovastatin (MEVACOR) 20 MG tablet Take 20 mg by mouth at bedtime.    [provider]  magnesium  oxide (MAG-OX) 400 MG tablet Take 400 mg by mouth daily.    [provider]  Multiple Vitamin (MULTIVITAMIN WITH MINERALS) TABS Take 1 tablet by mouth daily.    [provider]  raloxifene (EVISTA) 60 MG tablet Take 60 mg by mouth daily.    [provider]    Allergies: Amoxicillin, Hydrochlorothiazide, Keflex [cephalexin], and Macrodantin [nitrofurantoin macrocrystal]    Review of Systems  Neurological:  Positive for headaches.  All other systems reviewed and are negative.   Updated Vital Signs BP (!) 157/70   Pulse 77   Temp 98.2 F (36.8 C) (Oral)   Resp 18   SpO2 97%   Physical Exam Vitals and nursing  note reviewed.  Constitutional:      Appearance: Normal appearance.  HENT:     Head: Normocephalic.     Comments: Large hematoma to right forehead    Right Ear: External ear normal.     Left Ear: External ear normal.     Nose: Nose normal.     Mouth/Throat:     Mouth: Mucous membranes are moist.     Pharynx: Oropharynx is clear.  Eyes:     Extraocular Movements: Extraocular movements intact.     Conjunctiva/sclera: Conjunctivae normal.     Pupils: Pupils are equal, round, and reactive to light.  Cardiovascular:     Rate and Rhythm: Normal rate and regular rhythm.     Pulses: Normal pulses.     Heart sounds: Normal heart sounds.  Pulmonary:     Effort: Pulmonary effort is normal.  Abdominal:     General: Abdomen is flat. Bowel sounds are normal.     Palpations: Abdomen is soft.  Musculoskeletal:        General: Normal range of motion.     Cervical back: Normal range of motion and neck supple.  Skin:    General: Skin is warm.     Capillary Refill: Capillary refill takes less than 2 seconds.  Neurological:     General: No focal deficit present.  Mental Status: She is alert and oriented to person, place, and time.  Psychiatric:        Mood and Affect: Mood normal.        Behavior: Behavior normal.     (all labs ordered are listed, but only abnormal results are displayed) Labs Reviewed - No data to display  EKG: None  Radiology: CT Cervical Spine Wo Contrast Result Date: 12/09/2023 CLINICAL DATA:  Tripped and fell, trauma EXAM: CT CERVICAL SPINE WITHOUT CONTRAST TECHNIQUE: Multidetector CT imaging of the cervical spine was performed without intravenous contrast. Multiplanar CT image reconstructions were also generated. RADIATION DOSE REDUCTION: This exam was performed according to the departmental dose-optimization program which includes automated exposure control, adjustment of the mA and/or kV according to patient size and/or use of iterative reconstruction technique.  COMPARISON:  None Available. FINDINGS: Alignment: There is mild degenerative retrolisthesis of C3 on C4 and C4 on C5. Otherwise alignment is anatomic. Skull base and vertebrae: No acute fracture. No primary bone lesion or focal pathologic process. Soft tissues and spinal canal: No prevertebral fluid or swelling. No visible canal hematoma. Disc levels: Mild spondylosis from C3-4 through C5-6. Multilevel facet hypertrophy greatest at the cervicothoracic junction. Upper chest: Airway is patent. Lung apices are clear. Prominent atherosclerosis of the thoracic aorta. Other: Reconstructed images demonstrate no additional findings. IMPRESSION: 1. No acute cervical spine fracture. 2. Multilevel cervical degenerative changes. 3.  Aortic Atherosclerosis (ICD10-I70.0). Electronically Signed   By: Ozell Daring M.D.   On: 12/09/2023 16:55   CT Head Wo Contrast Result Date: 12/09/2023 CLINICAL DATA:  Tripped and fell EXAM: CT HEAD WITHOUT CONTRAST TECHNIQUE: Contiguous axial images were obtained from the base of the skull through the vertex without intravenous contrast. RADIATION DOSE REDUCTION: This exam was performed according to the departmental dose-optimization program which includes automated exposure control, adjustment of the mA and/or kV according to patient size and/or use of iterative reconstruction technique. COMPARISON:  12/01/2011 FINDINGS: Brain: Hypodensities throughout the periventricular white matter consistent with chronic small vessel ischemic changes. Diffuse age-appropriate cerebral atrophy. No acute infarct or hemorrhage. Lateral ventricles and remaining midline structures are unremarkable. No acute extra-axial fluid collections. No mass effect. Vascular: No hyperdense vessel or unexpected calcification. Skull: Right frontal scalp hematoma.  No underlying fracture. Sinuses/Orbits: No acute finding. Other: None. IMPRESSION: 1. Right frontal scalp hematoma.  No underlying fracture. 2. No acute  intracranial process. Electronically Signed   By: Ozell Daring M.D.   On: 12/09/2023 16:52     Procedures   Medications Ordered in the ED  acetaminophen  (TYLENOL ) tablet 1,000 mg (1,000 mg Oral Given 12/09/23 1618)                                    Medical Decision Making Amount and/or Complexity of Data Reviewed Radiology: ordered.  Risk OTC drugs.   This patient presents to the ED for concern of fall, this involves an extensive number of treatment options, and is a complaint that carries with it a high risk of complications and morbidity.  The differential diagnosis includes multiple trauma   Co morbidities that complicate the patient evaluation  htn, hld, gerd, osteoporosis, and hypothyroidism   Additional history obtained:  Additional history obtained from epic chart review External records from outside source obtained and reviewed including family   Imaging Studies ordered:  I ordered imaging studies including ct head/ct cpine  I independently visualized  and interpreted imaging which showed  CT head:  Right frontal scalp hematoma.  No underlying fracture.  2. No acute intracranial process.  CT cervical spine:  No acute cervical spine fracture.  2. Multilevel cervical degenerative changes.  3.  Aortic Atherosclerosis (ICD10-I70.0).   I agree with the radiologist interpretation   Medicines ordered and prescription drug management:  I ordered medication including tylenol   for sx  Reevaluation of the patient after these medicines showed that the patient improved I have reviewed the patients home medicines and have made adjustments as needed   Test Considered:  ct  Problem List / ED Course:  Cephalohematoma:  pt is stable for d/c.  Return if worse.    Reevaluation:  After the interventions noted above, I reevaluated the patient and found that they have :improved   Social Determinants of Health:  Lives at home   Dispostion:  After  consideration of the diagnostic results and the patients response to treatment, I feel that the patent would benefit from discharge with outpatient f/u.       Final diagnoses:  Fall, initial encounter  Traumatic cephalohematoma, initial encounter    ED Discharge Orders     None          Dean Clarity, MD 12/09/23 2026

## 2023-12-14 DIAGNOSIS — R609 Edema, unspecified: Secondary | ICD-10-CM | POA: Diagnosis not present

## 2023-12-14 DIAGNOSIS — E7800 Pure hypercholesterolemia, unspecified: Secondary | ICD-10-CM | POA: Diagnosis not present

## 2023-12-14 DIAGNOSIS — W19XXXA Unspecified fall, initial encounter: Secondary | ICD-10-CM | POA: Diagnosis not present

## 2023-12-14 DIAGNOSIS — M810 Age-related osteoporosis without current pathological fracture: Secondary | ICD-10-CM | POA: Diagnosis not present

## 2023-12-14 DIAGNOSIS — R5381 Other malaise: Secondary | ICD-10-CM | POA: Diagnosis not present

## 2023-12-14 DIAGNOSIS — I10 Essential (primary) hypertension: Secondary | ICD-10-CM | POA: Diagnosis not present

## 2023-12-14 DIAGNOSIS — E039 Hypothyroidism, unspecified: Secondary | ICD-10-CM | POA: Diagnosis not present

## 2023-12-14 DIAGNOSIS — S0093XD Contusion of unspecified part of head, subsequent encounter: Secondary | ICD-10-CM | POA: Diagnosis not present
